# Patient Record
Sex: Female | Born: 1985 | Race: Black or African American | Hispanic: No | Marital: Single | Smoking: Never smoker
Health system: Southern US, Community
[De-identification: ages and names within clinical notes are randomized; demographics above are authoritative.]

## PROBLEM LIST (undated history)

## (undated) DIAGNOSIS — R002 Palpitations: Secondary | ICD-10-CM

## (undated) DIAGNOSIS — D649 Anemia, unspecified: Secondary | ICD-10-CM

## (undated) DIAGNOSIS — I340 Nonrheumatic mitral (valve) insufficiency: Secondary | ICD-10-CM

## (undated) DIAGNOSIS — R5383 Other fatigue: Secondary | ICD-10-CM

## (undated) DIAGNOSIS — R011 Cardiac murmur, unspecified: Secondary | ICD-10-CM

## (undated) DIAGNOSIS — R0789 Other chest pain: Secondary | ICD-10-CM

## (undated) HISTORY — DX: Anemia, unspecified: D64.9

## (undated) HISTORY — DX: Cardiac murmur, unspecified: R01.1

## (undated) HISTORY — DX: Nonrheumatic mitral (valve) insufficiency: I34.0

## (undated) HISTORY — DX: Other fatigue: R53.83

## (undated) HISTORY — DX: Other chest pain: R07.89

## (undated) HISTORY — DX: Palpitations: R00.2

---

## 2006-11-27 ENCOUNTER — Emergency Department (HOSPITAL_COMMUNITY): Admission: EM | Admit: 2006-11-27 | Discharge: 2006-11-27 | Payer: Self-pay | Admitting: Family Medicine

## 2008-12-17 ENCOUNTER — Emergency Department (HOSPITAL_COMMUNITY): Admission: EM | Admit: 2008-12-17 | Discharge: 2008-12-17 | Payer: Self-pay | Admitting: Emergency Medicine

## 2009-04-27 ENCOUNTER — Emergency Department (HOSPITAL_COMMUNITY): Admission: EM | Admit: 2009-04-27 | Discharge: 2009-04-27 | Payer: Self-pay | Admitting: Emergency Medicine

## 2010-05-28 ENCOUNTER — Emergency Department (HOSPITAL_COMMUNITY): Admission: EM | Admit: 2010-05-28 | Discharge: 2010-05-29 | Payer: Self-pay | Admitting: Internal Medicine

## 2010-09-15 ENCOUNTER — Ambulatory Visit: Payer: Self-pay | Admitting: Obstetrics and Gynecology

## 2010-12-15 LAB — URINALYSIS, ROUTINE W REFLEX MICROSCOPIC
Bilirubin Urine: NEGATIVE
Nitrite: NEGATIVE
Urobilinogen, UA: 0.2 mg/dL (ref 0.0–1.0)
pH: 6 (ref 5.0–8.0)

## 2010-12-15 LAB — CBC
HCT: 28 % — ABNORMAL LOW (ref 36.0–46.0)
Hemoglobin: 9.5 g/dL — ABNORMAL LOW (ref 12.0–15.0)
MCH: 29.2 pg (ref 26.0–34.0)
MCHC: 33.9 g/dL (ref 30.0–36.0)
MCV: 86.2 fL (ref 78.0–100.0)
Platelets: 254 10*3/uL (ref 150–400)
RBC: 3.25 MIL/uL — ABNORMAL LOW (ref 3.87–5.11)
RDW: 12.3 % (ref 11.5–15.5)
WBC: 14.1 10*3/uL — ABNORMAL HIGH (ref 4.0–10.5)

## 2010-12-15 LAB — LIPASE, BLOOD: Lipase: 20 U/L (ref 11–59)

## 2010-12-15 LAB — COMPREHENSIVE METABOLIC PANEL
Alkaline Phosphatase: 36 U/L — ABNORMAL LOW (ref 39–117)
BUN: 11 mg/dL (ref 6–23)
Calcium: 8.7 mg/dL (ref 8.4–10.5)
GFR calc non Af Amer: >60 mL/min (ref >60)
Total Bilirubin: 1.1 mg/dL (ref 0.3–1.2)
Total Protein: 7.1 g/dL (ref 6.0–8.3)

## 2010-12-15 LAB — DIFFERENTIAL
Basophils Absolute: 0 10*3/uL (ref 0.0–0.1)
Eosinophils Relative: 0 % (ref 0–5)
Lymphocytes Relative: 11 % — ABNORMAL LOW (ref 12–46)
Neutrophils Relative %: 84 % — ABNORMAL HIGH (ref 43–77)

## 2011-01-07 LAB — POCT URINALYSIS DIP (DEVICE)
Bilirubin Urine: NEGATIVE
Glucose, UA: NEGATIVE mg/dL
Hgb urine dipstick: NEGATIVE
Ketones, ur: 40 mg/dL — AB
Urobilinogen, UA: 0.2 mg/dL (ref 0.0–1.0)
pH: 5.5 (ref 5.0–8.0)

## 2011-01-08 DIAGNOSIS — J309 Allergic rhinitis, unspecified: Secondary | ICD-10-CM | POA: Insufficient documentation

## 2011-01-11 LAB — POCT URINALYSIS DIP (DEVICE)
Bilirubin Urine: NEGATIVE
Glucose, UA: NEGATIVE mg/dL
Specific Gravity, Urine: 1.015 (ref 1.005–1.030)

## 2011-01-11 LAB — WET PREP, GENITAL: Yeast Wet Prep HPF POC: NONE SEEN

## 2011-01-11 LAB — GC/CHLAMYDIA PROBE AMP, GENITAL: Chlamydia, DNA Probe: POSITIVE — AB

## 2011-01-11 LAB — POCT PREGNANCY, URINE: Preg Test, Ur: NEGATIVE

## 2011-04-10 ENCOUNTER — Ambulatory Visit (HOSPITAL_COMMUNITY): Admission: EM | Admit: 2011-04-10 | Payer: Self-pay | Source: Ambulatory Visit | Admitting: Obstetrics and Gynecology

## 2013-01-12 DIAGNOSIS — N926 Irregular menstruation, unspecified: Secondary | ICD-10-CM | POA: Insufficient documentation

## 2013-08-07 DIAGNOSIS — N899 Noninflammatory disorder of vagina, unspecified: Secondary | ICD-10-CM | POA: Insufficient documentation

## 2013-09-29 DIAGNOSIS — Z8742 Personal history of other diseases of the female genital tract: Secondary | ICD-10-CM | POA: Insufficient documentation

## 2014-01-21 DIAGNOSIS — D509 Iron deficiency anemia, unspecified: Secondary | ICD-10-CM | POA: Insufficient documentation

## 2014-01-21 DIAGNOSIS — IMO0002 Reserved for concepts with insufficient information to code with codable children: Secondary | ICD-10-CM | POA: Insufficient documentation

## 2016-01-03 ENCOUNTER — Encounter: Payer: Self-pay | Admitting: *Deleted

## 2016-01-27 ENCOUNTER — Ambulatory Visit (INDEPENDENT_AMBULATORY_CARE_PROVIDER_SITE_OTHER): Payer: BLUE CROSS/BLUE SHIELD | Admitting: Family Medicine

## 2016-01-27 ENCOUNTER — Encounter: Payer: Self-pay | Admitting: Family Medicine

## 2016-01-27 VITALS — BP 141/83 | HR 82 | Wt 132.0 lb

## 2016-01-27 DIAGNOSIS — N898 Other specified noninflammatory disorders of vagina: Secondary | ICD-10-CM | POA: Diagnosis not present

## 2016-01-27 DIAGNOSIS — Z01419 Encounter for gynecological examination (general) (routine) without abnormal findings: Secondary | ICD-10-CM | POA: Diagnosis not present

## 2016-01-27 DIAGNOSIS — Z0000 Encounter for general adult medical examination without abnormal findings: Secondary | ICD-10-CM

## 2016-01-27 DIAGNOSIS — Z1151 Encounter for screening for human papillomavirus (HPV): Secondary | ICD-10-CM | POA: Diagnosis not present

## 2016-01-27 DIAGNOSIS — Z124 Encounter for screening for malignant neoplasm of cervix: Secondary | ICD-10-CM | POA: Diagnosis not present

## 2016-01-27 NOTE — Progress Notes (Signed)
  Subjective:     Samantha Tapia is a 30 y.o. female here for a routine exam.  Current complaints: none.  Personal health questionnaire reviewed: not asked.   Gynecologic History Patient's last menstrual period was 01/11/2016. Contraception: none Last Pap: last year. Results were: normal Last mammogram: n/A Obstetric History OB History  No data available     The following portions of the patient's history were reviewed and updated as appropriate: allergies, current medications, past family history, past medical history, past social history, past surgical history and problem list.  Review of Systems Pertinent items are noted in HPI.    Objective:    BP 141/83 mmHg  Pulse 82  Wt 132 lb (59.875 kg)  LMP 01/11/2016  General Appearance:    Alert, cooperative, no distress, appears stated age  Head:    Normocephalic, without obvious abnormality, atraumatic  Eyes:    PERRL, conjunctiva/corneas clear, EOM's intact, fundi    benign, both eyes  Neck:   Supple, symmetrical, trachea midline, no adenopathy;    thyroid:  no enlargement/tenderness/nodules; no carotid   bruit or JVD  Back:     Symmetric, no curvature, ROM normal, no CVA tenderness  Lungs:     Clear to auscultation bilaterally, respirations unlabored  Chest Wall:    No tenderness or deformity   Heart:    Regular rate and rhythm, S1 and S2 normal, no murmur, rub   or gallop  Breast Exam:    deferred  Abdomen:     Soft, non-tender, bowel sounds active all four quadrants,    no masses, no organomegaly  Genitalia:    Normal female without lesion, discharge or tenderness  Extremities:   Extremities normal, atraumatic, no cyanosis or edema  Pulses:   2+ and symmetric all extremities  Skin:   Skin color, texture, turgor normal, no rashes or lesions  Lymph nodes:   Cervical, supraclavicular, and axillary nodes normal  Neurologic:   CNII-XII intact, normal strength, sensation and reflexes    throughout      Assessment:    Healthy female exam.    Plan:    Education reviewed: safe sex/STD prevention and self breast exams. Contraception: none. Follow up in: 1 year.  Pap done, wet prep

## 2016-01-28 ENCOUNTER — Other Ambulatory Visit: Payer: Self-pay | Admitting: Family Medicine

## 2016-01-28 DIAGNOSIS — N760 Acute vaginitis: Principal | ICD-10-CM

## 2016-01-28 DIAGNOSIS — B9689 Other specified bacterial agents as the cause of diseases classified elsewhere: Secondary | ICD-10-CM

## 2016-01-28 LAB — WET PREP, GENITAL
TRICH WET PREP: NONE SEEN
Yeast Wet Prep HPF POC: NONE SEEN

## 2016-01-29 ENCOUNTER — Other Ambulatory Visit: Payer: Self-pay | Admitting: Family Medicine

## 2016-01-29 MED ORDER — METRONIDAZOLE 500 MG PO TABS: 500.0000 mg | ORAL_TABLET | Freq: Two times a day (BID) | ORAL | Status: DC

## 2016-01-30 LAB — CYTOLOGY - PAP

## 2016-01-31 MED ORDER — METRONIDAZOLE 0.75 % VA GEL: 1.0000 | Freq: Two times a day (BID) | VAGINAL | Status: DC

## 2016-01-31 MED ORDER — METRONIDAZOLE 0.75 % VA GEL: 1.0000 | Freq: Two times a day (BID) | VAGINAL | Status: AC

## 2016-01-31 MED ORDER — METRONIDAZOLE 0.75 % VA GEL
1.0000 | Freq: Two times a day (BID) | VAGINAL | Status: DC
Start: 2016-01-31 — End: 2016-01-31

## 2016-02-23 ENCOUNTER — Emergency Department (HOSPITAL_COMMUNITY)
Admission: EM | Admit: 2016-02-23 | Discharge: 2016-02-23 | Disposition: A | Discharge status: Home / Self Care | Payer: BLUE CROSS/BLUE SHIELD | Attending: Emergency Medicine | Admitting: Emergency Medicine

## 2016-02-23 ENCOUNTER — Encounter (HOSPITAL_COMMUNITY): Payer: Self-pay | Admitting: Neurology

## 2016-02-23 DIAGNOSIS — Y939 Activity, unspecified: Secondary | ICD-10-CM | POA: Diagnosis not present

## 2016-02-23 DIAGNOSIS — S0990XA Unspecified injury of head, initial encounter: Secondary | ICD-10-CM | POA: Diagnosis not present

## 2016-02-23 DIAGNOSIS — Y92410 Unspecified street and highway as the place of occurrence of the external cause: Secondary | ICD-10-CM | POA: Insufficient documentation

## 2016-02-23 DIAGNOSIS — Y999 Unspecified external cause status: Secondary | ICD-10-CM | POA: Diagnosis not present
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
## 2016-02-23 NOTE — Discharge Instructions (Signed)

## 2017-07-03 ENCOUNTER — Encounter (HOSPITAL_BASED_OUTPATIENT_CLINIC_OR_DEPARTMENT_OTHER): Payer: Self-pay | Admitting: Emergency Medicine

## 2017-07-03 ENCOUNTER — Emergency Department (HOSPITAL_BASED_OUTPATIENT_CLINIC_OR_DEPARTMENT_OTHER): Payer: Self-pay

## 2017-07-03 ENCOUNTER — Emergency Department (HOSPITAL_BASED_OUTPATIENT_CLINIC_OR_DEPARTMENT_OTHER)
Admission: EM | Admit: 2017-07-03 | Discharge: 2017-07-03 | Disposition: A | Discharge status: Home / Self Care | Payer: Self-pay | Attending: Emergency Medicine | Admitting: Emergency Medicine

## 2017-07-03 DIAGNOSIS — Y929 Unspecified place or not applicable: Secondary | ICD-10-CM | POA: Insufficient documentation

## 2017-07-03 DIAGNOSIS — Y9389 Activity, other specified: Secondary | ICD-10-CM | POA: Insufficient documentation

## 2017-07-03 DIAGNOSIS — W230XXA Caught, crushed, jammed, or pinched between moving objects, initial encounter: Secondary | ICD-10-CM | POA: Insufficient documentation

## 2017-07-03 DIAGNOSIS — Y999 Unspecified external cause status: Secondary | ICD-10-CM | POA: Insufficient documentation

## 2017-07-03 DIAGNOSIS — M79644 Pain in right finger(s): Secondary | ICD-10-CM | POA: Insufficient documentation

## 2017-07-03 NOTE — ED Triage Notes (Signed)
R thumb pain and swelling after slamming it in a window 10 days ago.

## 2017-07-03 NOTE — Discharge Instructions (Signed)
Please read and follow all provided instructions.  Your diagnoses today include:  1. Thumb pain, right    Tests performed today include:  An x-ray of the affected area - does NOT show any broken bones  Vital signs. See below for your results today.   Medications prescribed:   None  Take any prescribed medications only as directed.  Home care instructions:   Follow any educational materials contained in this packet  Follow R.I.C.E. Protocol:  R - rest your injury   I  - use ice on injury without applying directly to skin  C - compress injury with bandage or splint  E - elevate the injury as much as possible  Follow-up instructions: Please follow-up with your primary care provider as needed if symptoms not improving.   Return instructions:   Please return if your fingers are numb or tingling, appear gray or blue, or you have severe pain (also elevate the arm and loosen splint or wrap if you were given one)  Please return to the Emergency Department if you experience worsening symptoms.   Please return if you have any other emergent concerns.  Additional Information:  Your vital signs today were: BP 124/74 (BP Location: Left Arm)    Pulse 72    Temp 98.5 F (36.9 C) (Oral)    Resp 18    Ht  (1.575 m)    Wt 63 kg (139 lb)    LMP 06/17/2017    SpO2 100%    BMI 25.42 kg/m  If your blood pressure (BP) was elevated above 135/85 this visit, please have this repeated by your doctor within one month. --------------

## 2017-07-03 NOTE — ED Provider Notes (Signed)
[facility]   CSN: [id number] Arrival date & time: 07/03/17  1813     History   Chief Complaint Chief Complaint  Patient presents with  . Finger Injury    HPI Samantha Tapia is a 31 y.o. female.  Patient presents with complaint of ongoing left thumb pain starting acutely and gradually improving since closing her thumb and a window 10 days ago. Patient had some swelling at the base of the fingernail over the first several days which has improved and she reported a throbbing pain at the onset. Her thumb is still sore although she is moving it well. She is concerned about a fracture.  Also complains of waxing and waning left medial eye pain and irritation over the past several months. No associated swelling or redness.      History reviewed. No pertinent past medical history.  There are no active problems to display for this patient.   History reviewed. No pertinent surgical history.  OB History    No data available       Home Medications    Prior to Admission medications   Medication Sig Start Date End Date Taking? Authorizing Provider  ferrous sulfate 325 (65 FE) MG tablet Take 325 mg by mouth daily with breakfast.    [provider]  metroNIDAZOLE (FLAGYL) 500 MG tablet Take 1 tablet (500 mg total) by mouth 2 (two) times daily. Patient not taking: Reported on 02/23/2016 01/29/16   Levie Heritage, DO  Multiple Vitamin (MULTIVITAMIN WITH MINERALS) TABS tablet Take 1 tablet by mouth daily.    [provider]    Family History No family history on file.  Social History Social History  Substance Use Topics  . Smoking status: Never Smoker  . Smokeless tobacco: Never Used  . Alcohol use 0.0 oz/week     Allergies   Shellfish-derived products and Sulfur   Review of Systems Review of Systems  Constitutional: Negative for activity change.  HENT: Negative for ear discharge and ear pain.   Eyes: Positive for pain  and redness.  Musculoskeletal: Positive for arthralgias. Negative for joint swelling.  Skin: Negative for wound.  Neurological: Negative for weakness and numbness.     Physical Exam Updated Vital Signs BP 124/74 (BP Location: Left Arm)   Pulse 72   Temp 98.5 F (36.9 C) (Oral)   Resp 18   Ht  (1.575 m)   Wt 63 kg (139 lb)   LMP 06/17/2017   SpO2 100%   BMI 25.42 kg/m   Physical Exam  Constitutional: She appears well-developed and well-nourished.  HENT:  Head: Normocephalic and atraumatic.  Eyes: Pupils are equal, round, and reactive to light.  Normal conjunctiva. Tear duct appears normal. No stye or cysts noted.  Neck: Normal range of motion. Neck supple.  Cardiovascular: Normal pulses.  Exam reveals no decreased pulses.   Musculoskeletal: She exhibits tenderness. She exhibits no edema.  Neurological: She is alert. No sensory deficit.  Motor, sensation, and vascular distal to the injury is fully intact. Patient with mild pain without swelling the inferior margin of the nail. Also mild pain with palpation between the MCP and IP joint. Patient with full range of motion of the digit.  Skin: Skin is warm and dry.  Psychiatric: She has a normal mood and affect.  Nursing note and vitals reviewed.    ED Treatments / Results  Labs (all labs ordered are listed, but only abnormal results are displayed) Labs Reviewed -  No data to display  EKG  EKG Interpretation None       Radiology No results found.  Procedures Procedures (including critical care time)  Medications Ordered in ED Medications - No data to display   Initial Impression / Assessment and Plan / ED Course  I have reviewed the triage vital signs and the nursing notes.  Pertinent labs & imaging results that were available during my care of the patient were reviewed by me and considered in my medical decision making (see chart for details).     Patient seen and examined. Awaiting x-ray.   Vital  signs reviewed and are as follows: BP 124/74 (BP Location: Left Arm)   Pulse 72   Temp 98.5 F (36.9 C) (Oral)   Resp 18   Ht  (1.575 m)   Wt 63 kg (139 lb)   LMP 06/17/2017   SpO2 100%   BMI 25.42 kg/m   Patient updated on x-ray results. Discussed typical resolution of symptoms. She may have a subungual hematoma which will resolve with time underneath her nail.  Discussed use of warm compresses antihistamines for any eye symptoms.  Patient urged to return with worsening symptoms or other concerns. Patient verbalized understanding and agrees with plan.    Final Clinical Impressions(s) / ED Diagnoses   Final diagnoses:  Thumb pain, right   Patient with pain in right thumb after injury nearly 2 weeks ago. No fracture noted. Will continue conservative treatment.  Patient with chronically intermittent eye symptoms. Normal exam tonight.  New Prescriptions Discharge Medication List as of 07/03/2017  7:45 PM       Renne Crigler, PA-C 07/03/17 2042    Long, Arlyss Repress, MD 07/04/17 [phone]

## 2018-03-13 ENCOUNTER — Other Ambulatory Visit: Payer: Self-pay | Admitting: Obstetrics and Gynecology

## 2018-09-06 ENCOUNTER — Other Ambulatory Visit: Payer: Self-pay

## 2018-09-06 ENCOUNTER — Encounter (HOSPITAL_BASED_OUTPATIENT_CLINIC_OR_DEPARTMENT_OTHER): Payer: Self-pay | Admitting: Student

## 2018-09-06 ENCOUNTER — Emergency Department (HOSPITAL_BASED_OUTPATIENT_CLINIC_OR_DEPARTMENT_OTHER)
Admission: EM | Admit: 2018-09-06 | Discharge: 2018-09-06 | Disposition: A | Discharge status: Home / Self Care | Payer: 59 | Attending: Emergency Medicine | Admitting: Emergency Medicine

## 2018-09-06 ENCOUNTER — Emergency Department (HOSPITAL_BASED_OUTPATIENT_CLINIC_OR_DEPARTMENT_OTHER): Payer: 59

## 2018-09-06 DIAGNOSIS — J069 Acute upper respiratory infection, unspecified: Secondary | ICD-10-CM | POA: Insufficient documentation

## 2018-09-06 DIAGNOSIS — B9789 Other viral agents as the cause of diseases classified elsewhere: Secondary | ICD-10-CM | POA: Insufficient documentation

## 2018-09-06 DIAGNOSIS — Z79899 Other long term (current) drug therapy: Secondary | ICD-10-CM | POA: Insufficient documentation

## 2018-09-06 DIAGNOSIS — R0981 Nasal congestion: Secondary | ICD-10-CM | POA: Diagnosis present

## 2018-09-06 MED ORDER — BENZONATATE 100 MG PO CAPS: 100.0000 mg | ORAL_CAPSULE | Freq: Three times a day (TID) | ORAL | 0 refills | Status: DC

## 2018-09-06 MED ORDER — FLUTICASONE PROPIONATE 50 MCG/ACT NA SUSP: 1.0000 | Freq: Every day | NASAL | 0 refills | Status: AC

## 2018-09-06 MED ORDER — NAPROXEN 500 MG PO TABS: 500.0000 mg | ORAL_TABLET | Freq: Two times a day (BID) | ORAL | 0 refills | Status: AC

## 2018-09-06 NOTE — ED Provider Notes (Signed)
[facility] EMERGENCY DEPARTMENT Provider Note   CSN: [id number] Arrival date & time: 09/06/18  1634     History   Chief Complaint Chief Complaint  Patient presents with  . Nasal Congestion    HPI Samantha Tapia Adney is a 32 y.o. female without significant past medical hx who presents to the ED with URI sxs for the past 7-10 days. States she has had congestion, rhinorrhea, and dry cough. Has tried multiple OTC modalities including mucinex, theraflu, and tylenol without relief. Denies fever, sore throat, ear pain, chest pain, or dyspnea. + sick contacts with similar sxs.   HPI  No past medical history on file.  There are no active problems to display for this patient.   No past surgical history on file.   OB History   None      Home Medications    Prior to Admission medications   Medication Sig Start Date End Date Taking? Authorizing Provider  ferrous sulfate 325 (65 FE) MG tablet Take 325 mg by mouth daily with breakfast.    [provider]  Multiple Vitamin (MULTIVITAMIN WITH MINERALS) TABS tablet Take 1 tablet by mouth daily.    [provider]    Family History No family history on file.  Social History Social History   Tobacco Use  . Smoking status: Never Smoker  . Smokeless tobacco: Never Used  Substance Use Topics  . Alcohol use: Yes    Alcohol/week: 0.0 standard drinks  . Drug use: Not on file     Allergies   Shellfish-derived products and Sulfur   Review of Systems Review of Systems  Constitutional: Negative for chills and fever.  HENT: Positive for congestion and rhinorrhea. Negative for ear pain, sore throat, trouble swallowing and voice change.   Respiratory: Positive for cough. Negative for shortness of breath.   Cardiovascular: Negative for chest pain.  Gastrointestinal: Negative for abdominal pain, diarrhea and vomiting.     Physical Exam Updated Vital Signs BP 119/75 (BP Location: Right Arm)   Pulse 92    Temp 99.2 F (37.3 C) (Oral)   Resp 16   Ht 5\' 2"  (1.575 m)   Wt 63.5 kg   LMP 08/13/2018   SpO2 100%   BMI 25.61 kg/m   Physical Exam Constitutional: He appears well-developed and well-nourished.  Non-toxic appearance. No distress.  HENT:  Head: Normocephalic and atraumatic.  Right Ear: Tympanic membrane and ear canal normal. Tympanic membrane is not erythematous, not retracted and not bulging.  Left Ear: Tympanic membrane and ear canal normal. Tympanic membrane is not erythematous, not retracted and not bulging.  Nose: Mucosal edema present. Right sinus exhibits no maxillary sinus tenderness and no frontal sinus tenderness. Left sinus exhibits no maxillary sinus tenderness and no frontal sinus tenderness.  Mouth/Throat: Uvula is midline and oropharynx is clear and moist. No oropharyngeal exudate.  Posterior oropharynx is symmetric appearing. Patient tolerating own secretions without difficulty. No trismus. No drooling. No hot potato voice. No swelling beneath the tongue, submandibular compartment is soft.   Eyes: Pupils are equal, round, and reactive to light. Conjunctivae and EOM are normal. Right eye exhibits no discharge. Left eye exhibits no discharge.  Neck: Normal range of motion. Neck supple. No neck rigidity. No edema and no erythema present.  Cardiovascular: Normal rate and regular rhythm.  Pulmonary/Chest: Effort normal and breath sounds normal. No respiratory distress. He has no wheezes. He has no rhonchi. He has no rales.  Respiration even and unlabored  Abdominal:  Soft. He exhibits no distension. There is no tenderness.  Neurological: He is alert.  Clear speech.   Skin: Skin is warm and dry. No rash noted.  Psychiatric: He has a normal mood and affect. His behavior is normal.  Nursing note and vitals reviewed.  ED Treatments / Results  Labs (all labs ordered are listed, but only abnormal results are displayed) Labs Reviewed - No data to  display  EKG None  Radiology Dg Chest 2 View  Result Date: 09/06/2018 CLINICAL DATA:  Cough and congestion for 1 week. EXAM: CHEST - 2 VIEW COMPARISON:  06/09/2018 FINDINGS: The heart size and mediastinal contours are within normal limits. Both lungs are clear. The visualized skeletal structures are unremarkable. IMPRESSION: Normal chest x-ray. Electronically Signed   By: Rudie Meyer M.D.   On: 09/06/2018 18:42    Procedures Procedures (including critical care time)  Medications Ordered in ED Medications - No data to display   Initial Impression / Assessment and Plan / ED Course  I have reviewed the triage vital signs and the nursing notes.  Pertinent labs & imaging results that were available during my care of the patient were reviewed by me and considered in my medical decision making (see chart for details).    Patient presents with URI type symptoms.  Patient is nontoxic appearing, in no apparent distress, vitals are WNL. Patient is afebrile in the ED, lungs are CTA, CXR negative for infiltrate, doubt pneumonia. There is no wheezing or signs of respiratory distress. Afebrile, no sinus tenderness, doubt acute bacterial sinusitis. Centor score 0, doubt strep pharyngitis. No evidence of AOM on exam. No meningeal signs.  Suspect viral vs allergic etiology at this time, favor viral, and will treat supportively with naproxen Flonase, and Tessalon. I discussed results, treatment plan, need for PCP follow-up, and return precautions with the patient. Provided opportunity for questions, patient confirmed understanding and is in agreement with plan.    Final Clinical Impressions(s) / ED Diagnoses   Final diagnoses:  Viral URI with cough    ED Discharge Orders         Ordered    fluticasone (FLONASE) 50 MCG/ACT nasal spray  Daily     09/06/18 1920    naproxen (NAPROSYN) 500 MG tablet  2 times daily     09/06/18 1920    benzonatate (TESSALON) 100 MG capsule  Every 8 hours     09/06/18  1920           Cherly Anderson, PA-C 09/06/18 1921    Arby Barrette, MD 09/11/18 [phone]

## 2018-09-06 NOTE — ED Triage Notes (Signed)
Congestion x 1 week. Dry cough. Cold sx a week ago which have improved

## 2018-09-06 NOTE — Discharge Instructions (Addendum)
You were seen in the emergency today for upper respiratory symptoms, we suspect your symptoms are related to allergies or a virus at this time. Your chest xray did not show pneumonia. I have prescribed you multiple medications to treat your symptoms.   -Flonase to be used 1 spray in each nostril daily.  This medication is used to treat your congestion.  -Tessalon can be taken once every 8 hours as needed.  This medication is used to treat your cough.  - Naproxen is a nonsteroidal anti-inflammatory medication that will help with pain and swelling. Be sure to take this medication as prescribed with food, 1 pill every 12 hours,  It should be taken with food, as it can cause stomach upset, and more seriously, stomach bleeding. Do not take other nonsteroidal anti-inflammatory medications with this such as Advil, Motrin, Aleve, Mobic, Goodie Powder, or Motrin.    You make take Tylenol per over the counter dosing with these medications.   We have prescribed you new medication(s) today. Discuss the medications prescribed today with your pharmacist as they can have adverse effects and interactions with your other medicines including over the counter and prescribed medications. Seek medical evaluation if you start to experience new or abnormal symptoms after taking one of these medicines, seek care immediately if you start to experience difficulty breathing, feeling of your throat closing, facial swelling, or rash as these could be indications of a more serious allergic reaction   You will need to follow-up with your primary care provider in 1 week if your symptoms have not improved.  If you do not have a primary care provider one is provided in your discharge instructions.  Return to the emergency department for any new or worsening symptoms including but not limited to persistent fever for 5 days, difficulty breathing, chest pain, rashes, passing out, or any other concerns.

## 2018-09-19 ENCOUNTER — Other Ambulatory Visit: Payer: Self-pay

## 2018-09-19 ENCOUNTER — Encounter (HOSPITAL_COMMUNITY): Payer: Self-pay

## 2018-09-19 ENCOUNTER — Emergency Department (HOSPITAL_COMMUNITY): Payer: 59

## 2018-09-19 ENCOUNTER — Emergency Department (HOSPITAL_COMMUNITY)
Admission: EM | Admit: 2018-09-19 | Discharge: 2018-09-19 | Disposition: A | Discharge status: Home / Self Care | Payer: 59 | Attending: Emergency Medicine | Admitting: Emergency Medicine

## 2018-09-19 DIAGNOSIS — Z79899 Other long term (current) drug therapy: Secondary | ICD-10-CM | POA: Insufficient documentation

## 2018-09-19 DIAGNOSIS — R002 Palpitations: Secondary | ICD-10-CM | POA: Diagnosis not present

## 2018-09-19 DIAGNOSIS — R079 Chest pain, unspecified: Secondary | ICD-10-CM | POA: Diagnosis present

## 2018-09-19 LAB — CBC
HCT: 36.7 % (ref 36.0–46.0)
Hemoglobin: 11.7 g/dL — ABNORMAL LOW (ref 12.0–15.0)
MCH: 29.3 pg (ref 26.0–34.0)
MCHC: 31.9 g/dL (ref 30.0–36.0)
MCV: 91.8 fL (ref 80.0–100.0)
PLATELETS: 427 10*3/uL — AB (ref 150–400)
RBC: 4 MIL/uL (ref 3.87–5.11)
RDW: 13 % (ref 11.5–15.5)
WBC: 8.5 10*3/uL (ref 4.0–10.5)
nRBC: 0 % (ref 0.0–0.2)

## 2018-09-19 LAB — BASIC METABOLIC PANEL
Anion gap: 12 (ref 5–15)
BUN: 13 mg/dL (ref 6–20)
CO2: 24 mmol/L (ref 22–32)
Calcium: 9.6 mg/dL (ref 8.9–10.3)
Chloride: 102 mmol/L (ref 98–111)
Creatinine, Ser: 0.75 mg/dL (ref 0.44–1.00)
GFR calc Af Amer: >60 mL/min (ref >60)
Glucose, Bld: 89 mg/dL (ref 70–99)
Potassium: 3.8 mmol/L (ref 3.5–5.1)
Sodium: 138 mmol/L (ref 135–145)

## 2018-09-19 LAB — I-STAT BETA HCG BLOOD, ED (MC, WL, AP ONLY): I-stat hCG, quantitative: <5 m[IU]/mL (ref <5)

## 2018-09-19 LAB — D-DIMER, QUANTITATIVE: D-Dimer, Quant: 0.27 ug/mL-FEU (ref 0.00–0.50)

## 2018-09-19 LAB — I-STAT TROPONIN, ED: TROPONIN I, POC: 0 ng/mL (ref 0.00–0.08)

## 2018-09-19 MED ORDER — ACETAMINOPHEN 325 MG PO TABS: 650.0000 mg | ORAL_TABLET | Freq: Once | ORAL | Status: DC

## 2018-09-19 NOTE — Discharge Instructions (Addendum)
Avoid avoid caffinated products, such as teas, colas, coffee, chocolate. Avoid over the counter cold medicines, herbal or "natural vitamin" products, and illicit drugs because they can contain stimulants.  Call your regular medical doctor Monday to schedule a follow up appointment next week. Call your Cardiologist on Monday to schedule a follow up appointment within the next week.  Return to the Emergency Department immediately if worsening.

## 2018-09-19 NOTE — ED Provider Notes (Signed)
[facility]-EMERGENCY DEPT Provider Note   CSN: [id number] Arrival date & time: 09/19/18  1745     History   Chief Complaint Chief Complaint  Patient presents with  . Chest Pain  . Palpitations    HPI Samantha Tapia is a 32 y.o. female.  HPI Pt was seen at 1805. Per pt, c/o gradual onset and persistence of multiple intermittent episodes of palpitations for the past 2 weeks, more constant for the past 1 week. Has been associated with constant mid-sternal chest "pressure" and "stabbing" right sided chest pain. States she feels a "fluttering sensation" in her chest and "feels like I can't catch my breath sometimes." Denies new meds. Denies cough, no fevers, no back pain, no abd pain, no N/V/D, no injury.    History reviewed. No pertinent past medical history.  There are no active problems to display for this patient.   History reviewed. No pertinent surgical history.   OB History   No obstetric history on file.      Home Medications    Prior to Admission medications   Medication Sig Start Date End Date Taking? Authorizing Provider  ferrous sulfate 325 (65 FE) MG tablet Take 325 mg by mouth daily with breakfast.   Yes [provider]  fluticasone (FLONASE) 50 MCG/ACT nasal spray Place 1 spray into both nostrils daily. Patient taking differently: Place 1 spray into both nostrils daily as needed for allergies.  09/06/18  Yes Petrucelli, Samantha R, PA-C  Multiple Vitamin (MULTIVITAMIN WITH MINERALS) TABS tablet Take 1 tablet by mouth daily.   Yes [provider]  Probiotic Product (PROBIOTIC-10) CAPS Take 1 capsule by mouth daily.   Yes [provider]  benzonatate (TESSALON) 100 MG capsule Take 1 capsule (100 mg total) by mouth every 8 (eight) hours. Patient not taking: Reported on 09/19/2018 09/06/18   Petrucelli, Lelon Mast R, PA-C  naproxen (NAPROSYN) 500 MG tablet Take 1 tablet (500 mg total) by mouth 2 (two) times  daily. Patient not taking: Reported on 09/19/2018 09/06/18   Petrucelli, Pleas Koch, PA-C    Family History Family History  Problem Relation Age of Onset  . Heart failure Mother   . Diabetes Father     Social History Social History   Tobacco Use  . Smoking status: Never Smoker  . Smokeless tobacco: Never Used  Substance Use Topics  . Alcohol use: Yes    Alcohol/week: 0.0 standard drinks  . Drug use: Never     Allergies   Shellfish-derived products and Sulfur   Review of Systems Review of Systems ROS: Statement: All systems negative except as marked or noted in the HPI; Constitutional: Negative for fever and chills. ; ; Eyes: Negative for eye pain, redness and discharge. ; ; ENMT: Negative for ear pain, hoarseness, nasal congestion, sinus pressure and sore throat. ; ; Cardiovascular: +CP, palpitations. Negative for diaphoresis, and peripheral edema. ; ; Respiratory: Negative for cough, wheezing and stridor. ; ; Gastrointestinal: Negative for nausea, vomiting, diarrhea, abdominal pain, blood in stool, hematemesis, jaundice and rectal bleeding. . ; ; Genitourinary: Negative for dysuria, flank pain and hematuria. ; ; Musculoskeletal: Negative for back pain and neck pain. Negative for swelling and trauma.; ; Skin: Negative for pruritus, rash, abrasions, blisters, bruising and skin lesion.; ; Neuro: Negative for headache, lightheadedness and neck stiffness. Negative for weakness, altered level of consciousness, altered mental status, extremity weakness, paresthesias, involuntary movement, seizure and syncope.       Physical Exam Updated Vital Signs  BP 120/79   Pulse 93   Temp 98.2 F (36.8 C) (Oral)   Resp 18   Ht 5\' 2"  (1.575 m)   Wt 63.5 kg   LMP 09/11/2018   SpO2 100%   BMI 25.61 kg/m   Physical Exam 1810: Physical examination:  Nursing notes reviewed; Vital signs and O2 SAT reviewed;  Constitutional: Well developed, Well nourished, Well hydrated, In no acute distress;  Head:  Normocephalic, atraumatic; Eyes: EOMI, PERRL, No scleral icterus; ENMT: Mouth and pharynx normal, Mucous membranes moist; Neck: Supple, Full range of motion, No lymphadenopathy; Cardiovascular: Regular rate and rhythm, No gallop. States she is having palpitations during exam, HR 80's and 90's on monitor.; Respiratory: Breath sounds clear & equal bilaterally, [place].  Speaking full sentences with ease, Normal respiratory effort/excursion; Chest: Nontender, Movement normal; Abdomen: Soft, Nontender, Nondistended, Normal bowel sounds; Genitourinary: No CVA tenderness; Extremities: Peripheral pulses normal, No tenderness, No edema, No calf edema or asymmetry.; Neuro: AA&Ox3, Major CN grossly intact.  Speech clear. No gross focal motor or sensory deficits in extremities.; Skin: Color normal, Warm, Dry.   ED Treatments / Results  Labs (all labs ordered are listed, but only abnormal results are displayed)   EKG EKG Interpretation  Date/Time:  Friday September 19 2018 17:54:42 EST Ventricular Rate:  83 PR Interval:    QRS Duration: 94 QT Interval:  372 QTC Calculation: 438 R Axis:   79 Text Interpretation:  Sinus rhythm LAE, consider biatrial enlargement Baseline wander No old tracing to compare Confirmed by Samuel JesterMcManus, Lil Lepage [phone]) on 09/19/2018 6:08:11 PM   Radiology   Procedures Procedures (including critical care time)  Medications Ordered in ED Medications  acetaminophen (TYLENOL) tablet 650 mg (0 mg Oral Hold 09/19/18 1944)     Initial Impression / Assessment and Plan / ED Course  I have reviewed the triage vital signs and the nursing notes.  Pertinent labs & imaging results that were available during my care of the patient were reviewed by me and considered in my medical decision making (see chart for details).  MDM Reviewed: previous chart, nursing note and vitals Reviewed previous: labs Interpretation: labs, ECG and x-ray   Results for orders placed or performed  during the hospital encounter of 09/19/18  Basic metabolic panel  Result Value Ref Range   Sodium 138 135 - 145 mmol/L   Potassium 3.8 3.5 - 5.1 mmol/L   Chloride 102 98 - 111 mmol/L   CO2 24 22 - 32 mmol/L   Glucose, Bld 89 70 - 99 mg/dL   BUN 13 6 - 20 mg/dL   Creatinine, Ser 6.040.75 0.44 - 1.00 mg/dL   Calcium 9.6 8.9 - 54.010.3 mg/dL   GFR calc non Af Amer >60 >60 mL/min   GFR calc Af Amer >60 >60 mL/min   Anion gap 12 5 - 15  CBC  Result Value Ref Range   WBC 8.5 4.0 - 10.5 K/uL   RBC 4.00 3.87 - 5.11 MIL/uL   Hemoglobin 11.7 (L) 12.0 - 15.0 g/dL   HCT 98.136.7 19.136.0 - 47.846.0 %   MCV 91.8 80.0 - 100.0 fL   MCH 29.3 26.0 - 34.0 pg   MCHC 31.9 30.0 - 36.0 g/dL   RDW 29.513.0 62.111.5 - 30.815.5 %   Platelets 427 (H) 150 - 400 K/uL   nRBC 0.0 0.0 - 0.2 %  D-dimer, quantitative  Result Value Ref Range   D-Dimer, Quant 0.27 0.00 - 0.50 ug/mL-FEU  I-stat troponin, ED  Result Value  Ref Range   Troponin i, poc 0.00 0.00 - 0.08 ng/mL   Comment 3          I-Stat beta hCG blood, ED  Result Value Ref Range   I-stat hCG, quantitative <5.0 <5 mIU/mL   Comment 3           Dg Chest 2 View Result Date: 09/19/2018 CLINICAL DATA:  Mid chest pressure and pain.  Shortness of breath. EXAM: CHEST - 2 VIEW COMPARISON:  09/06/2018 FINDINGS: The lungs appear clear.  Cardiac and mediastinal contours normal. No pleural effusion identified.  No appreciable bony abnormality. IMPRESSION: No active cardiopulmonary disease. Electronically Signed   By: Gaylyn RongWalter  Liebkemann M.D.   On: 09/19/2018 18:46    2030:  Doubt PE as cause for symptoms with normal d-dimer and low risk Wells.  Doubt ACS as cause for symptoms with normal troponin and EKG without acute STTW changes after 1 to 2 weeks of constant atypical symptoms.  Workup reassuring. Pt ambulated with O2 Sats remaining 100% R/A, HR 93, gait steady, resps easy, NAD. Pt now states to ED RN that she is "scheduled for an echocardiogram by her Cardiologist." Pt encouraged to f/u  with Cards MD as previously scheduled. Dx and testing d/w pt.  Questions answered.  Verb understanding, agreeable to d/c home with outpt f/u.    Final Clinical Impressions(s) / ED Diagnoses   Final diagnoses:  None    ED Discharge Orders    None       Samuel JesterMcManus, Kwaku Mostafa, DO 09/22/18 [id number]

## 2018-09-19 NOTE — ED Notes (Signed)
Ambulating pulse ox was 100%, HR 93. Pt states she felt "a little weezy" but tolerated ambulation well

## 2018-09-19 NOTE — ED Triage Notes (Addendum)
Patient c/o intermittent heart fluttering and last night she began having mid chest pressure and today, a stabbing pain, SOB, "feels like I can't catch my breath sometimes" and a fluttering sensation.  At the end of triage, patient states she saw her cardiologist and was scheduled for an echo, EKG was completed.

## 2018-10-07 ENCOUNTER — Encounter (HOSPITAL_COMMUNITY): Payer: Self-pay | Admitting: *Deleted

## 2018-10-07 ENCOUNTER — Emergency Department (HOSPITAL_COMMUNITY)
Admission: EM | Admit: 2018-10-07 | Discharge: 2018-10-07 | Disposition: A | Discharge status: Home / Self Care | Payer: 59 | Attending: Emergency Medicine | Admitting: Emergency Medicine

## 2018-10-07 ENCOUNTER — Emergency Department (HOSPITAL_COMMUNITY): Payer: 59

## 2018-10-07 DIAGNOSIS — Z79899 Other long term (current) drug therapy: Secondary | ICD-10-CM | POA: Diagnosis not present

## 2018-10-07 DIAGNOSIS — B9789 Other viral agents as the cause of diseases classified elsewhere: Secondary | ICD-10-CM | POA: Insufficient documentation

## 2018-10-07 DIAGNOSIS — R05 Cough: Secondary | ICD-10-CM | POA: Diagnosis present

## 2018-10-07 DIAGNOSIS — J069 Acute upper respiratory infection, unspecified: Secondary | ICD-10-CM | POA: Diagnosis not present

## 2018-10-07 MED ORDER — IBUPROFEN 200 MG PO TABS
600.0000 mg | ORAL_TABLET | Freq: Once | ORAL | Status: AC
  Administered 2018-10-07: 600 mg via ORAL
  Filled 2018-10-07: qty 3

## 2018-10-07 NOTE — Discharge Instructions (Addendum)
Please read instructions below.  You can take tylenol or ibuprofen as needed for sore throat or fever.  Drink plenty of water.  Use saline nasal spray for congestion. Follow up with your primary care provider as needed.  Return to the ER for inability to swallow liquids, difficulty breathing, or new or worsening symptoms.  

## 2018-10-07 NOTE — ED Triage Notes (Signed)
Pt complains of fever, sneezing, congestion, headache, facial pain, cough 4 days ago. Pt tried cold medicine w/o relief. Pt had fever of 102.5 yesterday. 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

## 2018-10-07 NOTE — ED Provider Notes (Signed)
[facility]-EMERGENCY DEPT Provider Note   CSN: [id number] Arrival date & time: 10/07/18  1301     History   Chief Complaint Chief Complaint  Patient presents with  . Fever  . Cough  . Nasal Congestion    HPI Lynnda Childashanka Noland is a 33 y.o. female without significant past medical history, presenting to the emergency department with 2 days of nasal congestion and fever.  Patient states initially symptoms began on Friday though were more consistent with her seasonal allergies, rhinorrhea, sneezing, watery eyes.  On Sunday she developed a fever with worsening nasal congestion.  She has associated pressure in her sinuses.  This morning she began having a dry cough. Denies sore throat, difficulty breathing or swallowing, abdominal symptoms, urinary symptoms.  Has treated symptoms with DayQuil, NyQuil, Tylenol and Zyrtec.  No known sick contacts however she does work at [employer].  Patient has not had the influenza vaccine this season.  The history is provided by the patient.    History reviewed. No pertinent past medical history.  There are no active problems to display for this patient.   History reviewed. No pertinent surgical history.   OB History   No obstetric history on file.      Home Medications    Prior to Admission medications   Medication Sig Start Date End Date Taking? Authorizing Provider  benzonatate (TESSALON) 100 MG capsule Take 1 capsule (100 mg total) by mouth every 8 (eight) hours. Patient not taking: Reported on 09/19/2018 09/06/18   Petrucelli, Lelon MastSamantha R, PA-C  ferrous sulfate 325 (65 FE) MG tablet Take 325 mg by mouth daily with breakfast.    [provider]  fluticasone (FLONASE) 50 MCG/ACT nasal spray Place 1 spray into both nostrils daily. Patient taking differently: Place 1 spray into both nostrils daily as needed for allergies.  09/06/18   Petrucelli, Samantha R, PA-C  Multiple Vitamin (MULTIVITAMIN WITH MINERALS) TABS  tablet Take 1 tablet by mouth daily.    [provider]  naproxen (NAPROSYN) 500 MG tablet Take 1 tablet (500 mg total) by mouth 2 (two) times daily. Patient not taking: Reported on 09/19/2018 09/06/18   Petrucelli, Lelon MastSamantha R, PA-C  Probiotic Product (PROBIOTIC-10) CAPS Take 1 capsule by mouth daily.    [provider]    Family History Family History  Problem Relation Age of Onset  . Heart failure Mother   . Diabetes Father     Social History Social History   Tobacco Use  . Smoking status: Never Smoker  . Smokeless tobacco: Never Used  Substance Use Topics  . Alcohol use: Yes    Alcohol/week: 0.0 standard drinks  . Drug use: Never     Allergies   Shellfish-derived products and Sulfur   Review of Systems Review of Systems  Constitutional: Positive for fever.  HENT: Positive for congestion, rhinorrhea, sinus pressure and sinus pain. Negative for ear pain, sore throat, trouble swallowing and voice change.   Respiratory: Positive for cough. Negative for shortness of breath.      Physical Exam Updated Vital Signs BP 122/85   Pulse (!) 108   Temp 99.9 F (37.7 C) (Oral)   Resp 20   LMP 09/11/2018   SpO2 100%   Physical Exam Vitals signs and nursing note reviewed.  Constitutional:      General: She is not in acute distress.    Appearance: She is well-developed. She is not ill-appearing or toxic-appearing.  HENT:     Head:  Normocephalic and atraumatic.     Right Ear: Tympanic membrane and ear canal normal.     Left Ear: Tympanic membrane and ear canal normal.     Nose:     Right Sinus: Frontal sinus tenderness present.     Left Sinus: Frontal sinus tenderness present.     Mouth/Throat:     Mouth: Mucous membranes are moist.     Pharynx: Oropharynx is clear.  Eyes:     Conjunctiva/sclera: Conjunctivae normal.  Neck:     Musculoskeletal: Normal range of motion and neck supple. No neck rigidity.  Cardiovascular:     Rate and Rhythm: Regular  rhythm. Tachycardia present.     Heart sounds: Normal heart sounds.  Pulmonary:     Effort: Pulmonary effort is normal.     Breath sounds: Normal breath sounds.  Lymphadenopathy:     Cervical: No cervical adenopathy.  Neurological:     Mental Status: She is alert.  Psychiatric:        Mood and Affect: Mood normal.        Behavior: Behavior normal.      ED Treatments / Results  Labs (all labs ordered are listed, but only abnormal results are displayed) Labs Reviewed - No data to display  EKG None  Radiology Dg Chest 2 View  Result Date: 10/07/2018 CLINICAL DATA:  Fever and cough for 4 days. EXAM: CHEST - 2 VIEW COMPARISON:  09/19/2018 FINDINGS: The heart size and mediastinal contours are within normal limits. Both lungs are clear. The visualized skeletal structures are unremarkable. IMPRESSION: Negative.  No active cardiopulmonary disease. Electronically Signed   By: Myles RosenthalJohn  Stahl M.D.   On: 10/07/2018 17:50    Procedures Procedures (including critical care time)  Medications Ordered in ED Medications  ibuprofen (ADVIL,MOTRIN) tablet 600 mg (600 mg Oral Given 10/07/18 1608)     Initial Impression / Assessment and Plan / ED Course  I have reviewed the triage vital signs and the nursing notes.  Pertinent labs & imaging results that were available during my care of the patient were reviewed by me and considered in my medical decision making (see chart for details).     Patients symptoms are consistent with URI, likely viral etiology. Consider influenza. Febrile on arrival with tachycardia, temp 100.52F and HR 117; however with improvement after antipyretics and oral hydration to 99.59F and HR 99. Tolerating secretions.  Lungs clear to auscultation bilaterally. CXR negative for acute infiltrate.  Discussed that antibiotics are not indicated for viral infections. Pt will be discharged with symptomatic treatment.  Verbalizes understanding and is agreeable with plan. Pt is  hemodynamically stable & in NAD prior to dc.  Discussed results, findings, treatment and follow up. Patient advised of return precautions. Patient verbalized understanding and agreed with plan.  Final Clinical Impressions(s) / ED Diagnoses   Final diagnoses:  Viral URI with cough    ED Discharge Orders    None       [place], SwazilandJordan N, PA-C 10/07/18 1813    Linwood DibblesKnapp, Jon, MD 10/08/18 1459

## 2019-01-15 ENCOUNTER — Encounter: Payer: Self-pay | Admitting: Cardiology

## 2019-01-16 NOTE — Progress Notes (Signed)
Subjective:  Primary Physician:  Iona Hansen, NP  Patient ID: Samantha Tapia, female    DOB: [birth year], 33 y.o.   MRN: [id number]  This visit type was conducted due to national recommendations for restrictions regarding the COVID-19 Pandemic (e.g. social distancing).  This format is felt to be most appropriate for this patient at this time.  All issues noted in this document were discussed and addressed.  No physical exam was performed (except for noted visual exam findings with Telehealth visits - very limited).  The patient has consented to conduct a Telehealth visit and understands insurance will be billed.   I connected with patient, on 01/19/19  by a video enabled telemedicine application and verified that I am speaking with the correct person using two identifiers.     I discussed the limitations of evaluation and management by telemedicine and the availability of in person appointments. The patient expressed understanding and agreed to proceed.   I have discussed with patient regarding the safety during COVID Pandemic and steps and precautions including social distancing with the patient.    Chief Complaint  Patient presents with  . Palpitations  . Follow-up    HPI: Samantha Tapia  is a 33 y.o. female . She has mild to moderate mitral regurgitation by echocardiogram in January 2020.  Patient had h/o palpitation and flutters lasting for a few seconds. Palpitations were more frequent when she had a viral respiratory infection. Symptoms have resolved now with metoprolol therapy. There is no dizziness, near-syncope or syncope.  Patient denies any chest pain, tightness or pressure. No complaints of shortness of breath, orthopnea or PND. No history of swelling on the legs.  No history of hypertension or diabetes. She does not know about her serum cholesterol level. She does not smoke. Patient has history of iron deficiency anemia for many years. No history of rheumatic fever.   She drinks gren tea occasionally. No history of taking any decongestants medications.    Past Medical History:  Diagnosis Date  . Anemia   . Atypical chest pain   . Fatigue   . Heart murmur   . Mitral regurgitation   . Palpitations     History reviewed. No pertinent surgical history.  Social History   Socioeconomic History  . Marital status: Single    Spouse name: Not on file  . Number of children: 0  . Years of education: Not on file  . Highest education level: Not on file  Occupational History  . Not on file  Social Needs  . Financial resource strain: Not on file  . Food insecurity:    Worry: Not on file    Inability: Not on file  . Transportation needs:    Medical: Not on file    Non-medical: Not on file  Tobacco Use  . Smoking status: Never Smoker  . Smokeless tobacco: Never Used  Substance and Sexual Activity  . Alcohol use: Yes    Alcohol/week: 0.0 standard drinks    Comment: OCC  . Drug use: Never  . Sexual activity: Not on file  Lifestyle  . Physical activity:    Days per week: Not on file    Minutes per session: Not on file  . Stress: Not on file  Relationships  . Social connections:    Talks on phone: Not on file    Gets together: Not on file    Attends religious service: Not on file    Active member of club or  organization: Not on file    Attends meetings of clubs or organizations: Not on file    Relationship status: Not on file  . Intimate partner violence:    Fear of current or ex partner: Not on file    Emotionally abused: Not on file    Physically abused: Not on file    Forced sexual activity: Not on file  Other Topics Concern  . Not on file  Social History Narrative  . Not on file    Current Outpatient Medications on File Prior to Visit  Medication Sig Dispense Refill  . benzonatate (TESSALON) 100 MG capsule Take 1 capsule (100 mg total) by mouth every 8 (eight) hours. 21 capsule 0  . ferrous sulfate 325 (65 FE) MG tablet Take  325 mg by mouth daily with breakfast.    . fluticasone (FLONASE) 50 MCG/ACT nasal spray Place 1 spray into both nostrils daily. (Patient taking differently: Place 1 spray into both nostrils daily as needed for allergies. ) 16 g 0  . metoprolol tartrate (LOPRESSOR) 25 MG tablet Take 25 mg by mouth daily.    . Multiple Vitamin (MULTIVITAMIN WITH MINERALS) TABS tablet Take 1 tablet by mouth daily.    . naproxen (NAPROSYN) 500 MG tablet Take 1 tablet (500 mg total) by mouth 2 (two) times daily. 10 tablet 0  . Probiotic Product (PROBIOTIC-10) CAPS Take 1 capsule by mouth as needed.     No current facility-administered medications on file prior to visit.     Review of Systems  Constitutional: Negative for fever.  HENT: Negative for nosebleeds.   Eyes: Negative for blurred vision.  Respiratory: Negative for cough and shortness of breath.   Cardiovascular: Negative for palpitations and leg swelling.  Gastrointestinal: Negative for abdominal pain, nausea and vomiting.  Genitourinary: Negative for dysuria.  Musculoskeletal: Negative for myalgias.  Skin: Negative for itching and rash.  Neurological: Negative for dizziness, seizures and loss of consciousness.  Psychiatric/Behavioral: The patient is not nervous/anxious.        Objective:  Pulse 90. There is no height or weight on file to calculate BMI. patient told me that she had  blood pressure check at physician's office 2 months ago and it was told to be normal.  She does not have blood pressure instrument at home.  Physical Exam  Patient is alert and oriented, she appeared very comfortable at the time of visit.  No further examination was possible as it was a telemedicine visit.  CARDIAC STUDIES:   Echo- 10/17/2018 1. Left ventricle cavity is normal in size. Normal global: wall motion. Calculated EF 68%. 2. Mild to moderate mitral regurgitation. Mild mitral valve leaflet thickening. Borderline prolapse of the mitral valve leaflets. 3.  Mild tricuspid regurgitation. No evidence of pulmonary hypertension. 4. IVC is dilated with respiratory variation. Findings suggest elevated CVP.  Treadmill stress test: 05/28/2014 Indications: Palpitations. Conclusions: Normal Test. The patient exercised according to the [facility] protocol, Total time recorded .0sec. achieving a max heart rate of 187 which was 97% of MPHR for age and 8.2 METS of work. The baseline ECG showed NSR,IRBBB. During exercise there was no ST-T changes of ischemia. No inducible arrhtymias. Normal BP. Normal exercise tolerence. Symptoms: Fatigue. Achieved 98% MPHR. Arrhythmia: None. Continue primary prevention.  Assessment & Recommendations:   1. Mild mitral regurgitation  2. Palpitation  Laboratory Exam:  CBC Latest Ref Rng & Units 09/19/2018 05/28/2010  WBC 4.0 - 10.5 K/uL 8.5 14.1(H)  Hemoglobin 12.0 - 15.0 g/dL 16.1(W)  9.5(L)  Hematocrit 36.0 - 46.0 % 36.7 28.0(L)  Platelets 150 - 400 K/uL 427(H) 254   CMP Latest Ref Rng & Units 09/19/2018 05/28/2010  Glucose 70 - 99 mg/dL 89 161(W113(H)  BUN 6 - 20 mg/dL 13 11  Creatinine 9.600.44 - 1.00 mg/dL 4.540.75 0.980.88  Sodium 119135 - 145 mmol/L 138 135  Potassium 3.5 - 5.1 mmol/L 3.8 3.4(L)  Chloride 98 - 111 mmol/L 102 107  CO2 22 - 32 mmol/L 24 24  Calcium 8.9 - 10.3 mg/dL 9.6 8.7  Total Protein 6.0 - 8.3 g/dL - 7.1  Total Bilirubin 0.3 - 1.2 mg/dL - 1.1  Alkaline Phos 39 - 117 U/L - 36(L)  AST 0 - 37 U/L - 24  ALT 0 - 35 U/L - 12   Lipid Panel  No results found for: CHOL, TRIG, HDL, CHOLHDL, VLDL, LDLCALC, LDLDIRECT   Recommendation:  She has symptoms of palpitation which appeared to be due to premature beats. There was no dizziness or near syncope. Symptoms have resolved with metoprolol therapy and patient is not feeling any more palpitation.  Primary prevention was again discussed with the patient. Follow low-salt, low-cholesterol diet and was advised to exercise regularly. She was also advised to avoid  caffeine, decongestant medications or any other stimulants.  She will return for follow-up after 6 months, but, call us if there are any cardiac problems in the interim.  Earl Manyhandra K Jaimya Feliciano, MD, [facility] Hospital[facility]ACC 01/19/2019, 10:06 AM [facility] Pager: [phone] Office: [phone] If no answer Cell [phone]

## 2019-01-19 ENCOUNTER — Other Ambulatory Visit: Payer: Self-pay

## 2019-01-19 ENCOUNTER — Ambulatory Visit (INDEPENDENT_AMBULATORY_CARE_PROVIDER_SITE_OTHER): Payer: 59 | Admitting: Cardiology

## 2019-01-19 ENCOUNTER — Encounter: Payer: Self-pay | Admitting: Cardiology

## 2019-01-19 VITALS — HR 90

## 2019-01-19 DIAGNOSIS — I10 Essential (primary) hypertension: Secondary | ICD-10-CM | POA: Insufficient documentation

## 2019-01-19 DIAGNOSIS — I340 Nonrheumatic mitral (valve) insufficiency: Secondary | ICD-10-CM | POA: Diagnosis not present

## 2019-01-19 DIAGNOSIS — R002 Palpitations: Secondary | ICD-10-CM

## 2019-04-01 IMAGING — CR DG CHEST 2V
2 series · 2 of 2 positions shown · non-contrast
Comparison: 09/19/2018

CLINICAL DATA: Fever and cough for 4 days.

EXAM:
CHEST - 2 VIEW

[w chest pa]
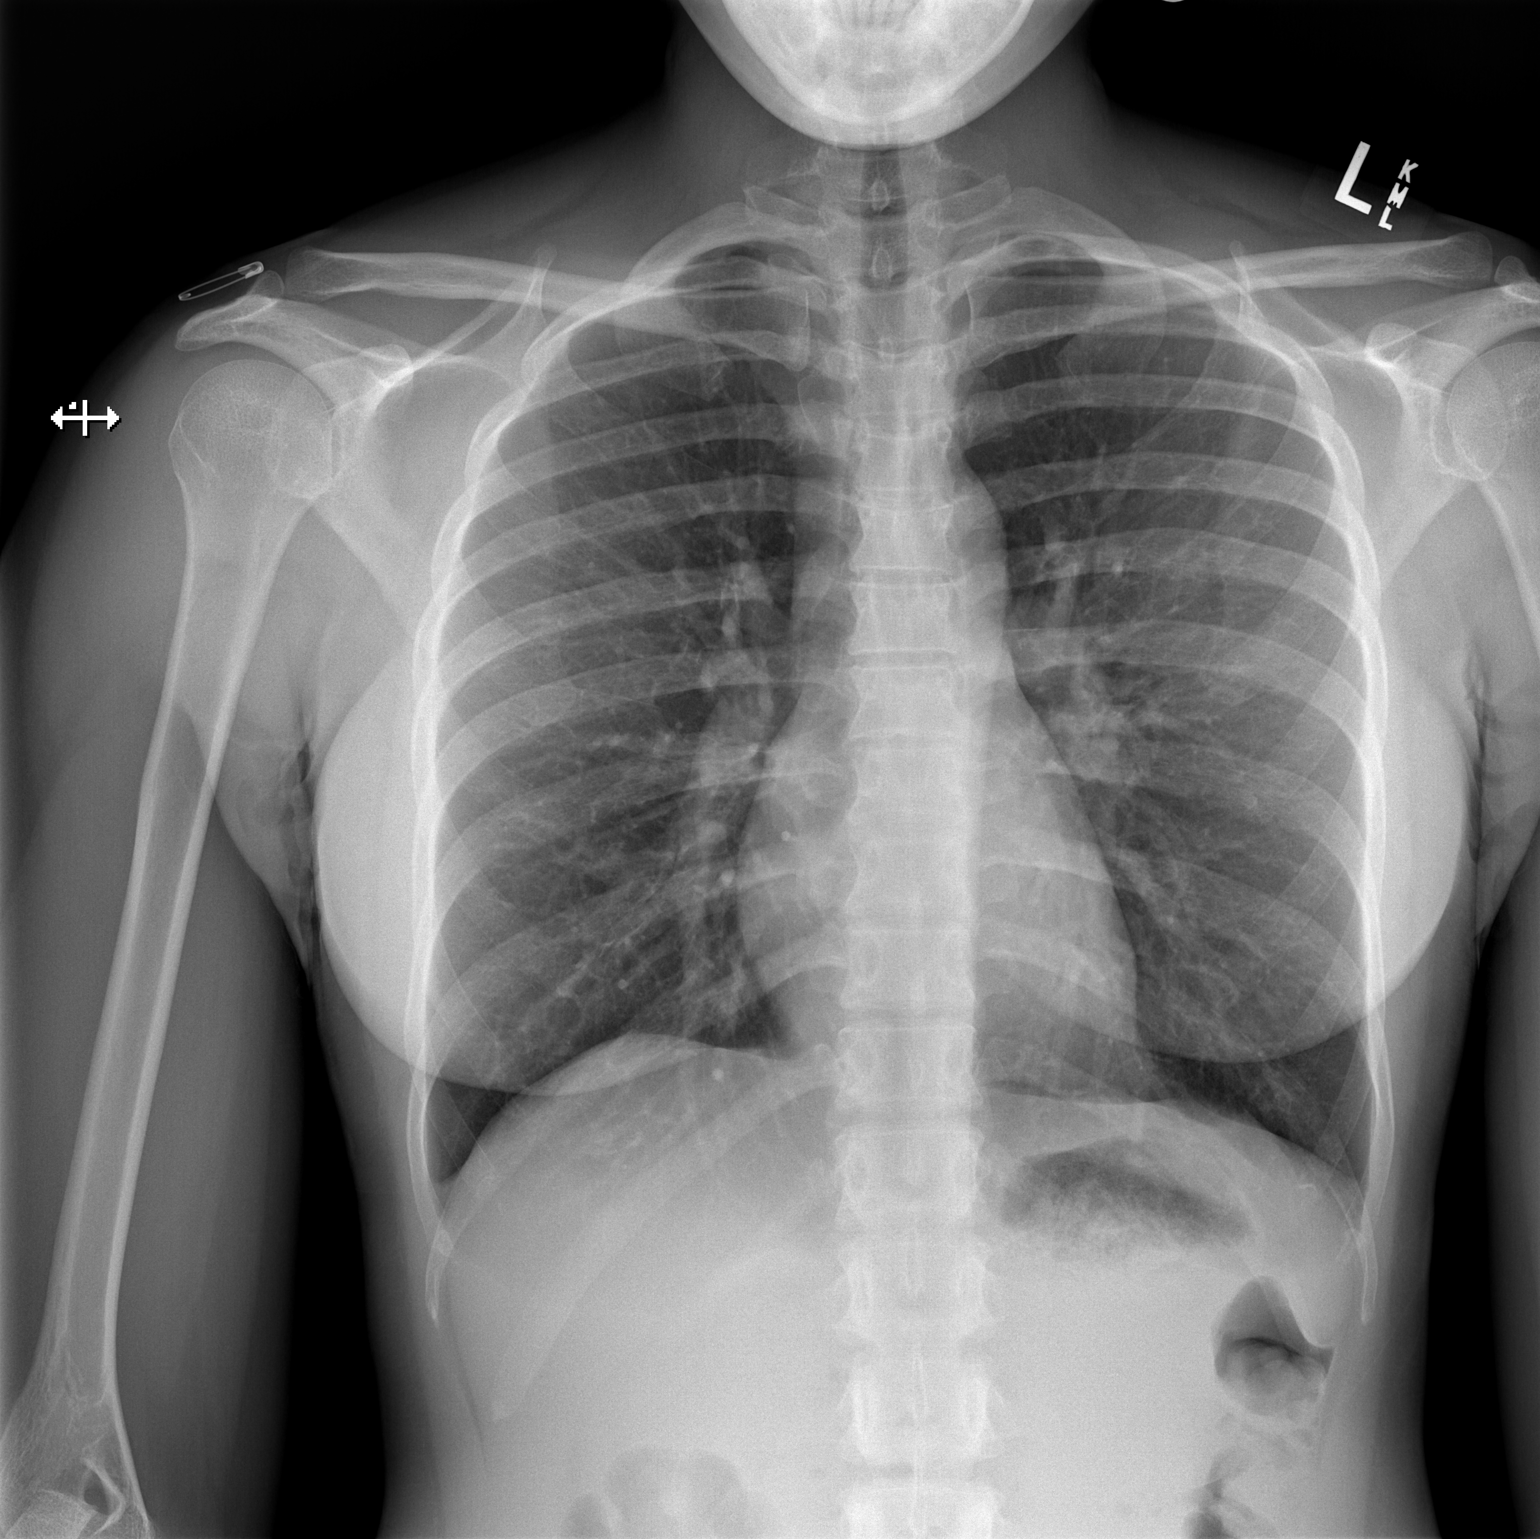

[w chest lat]
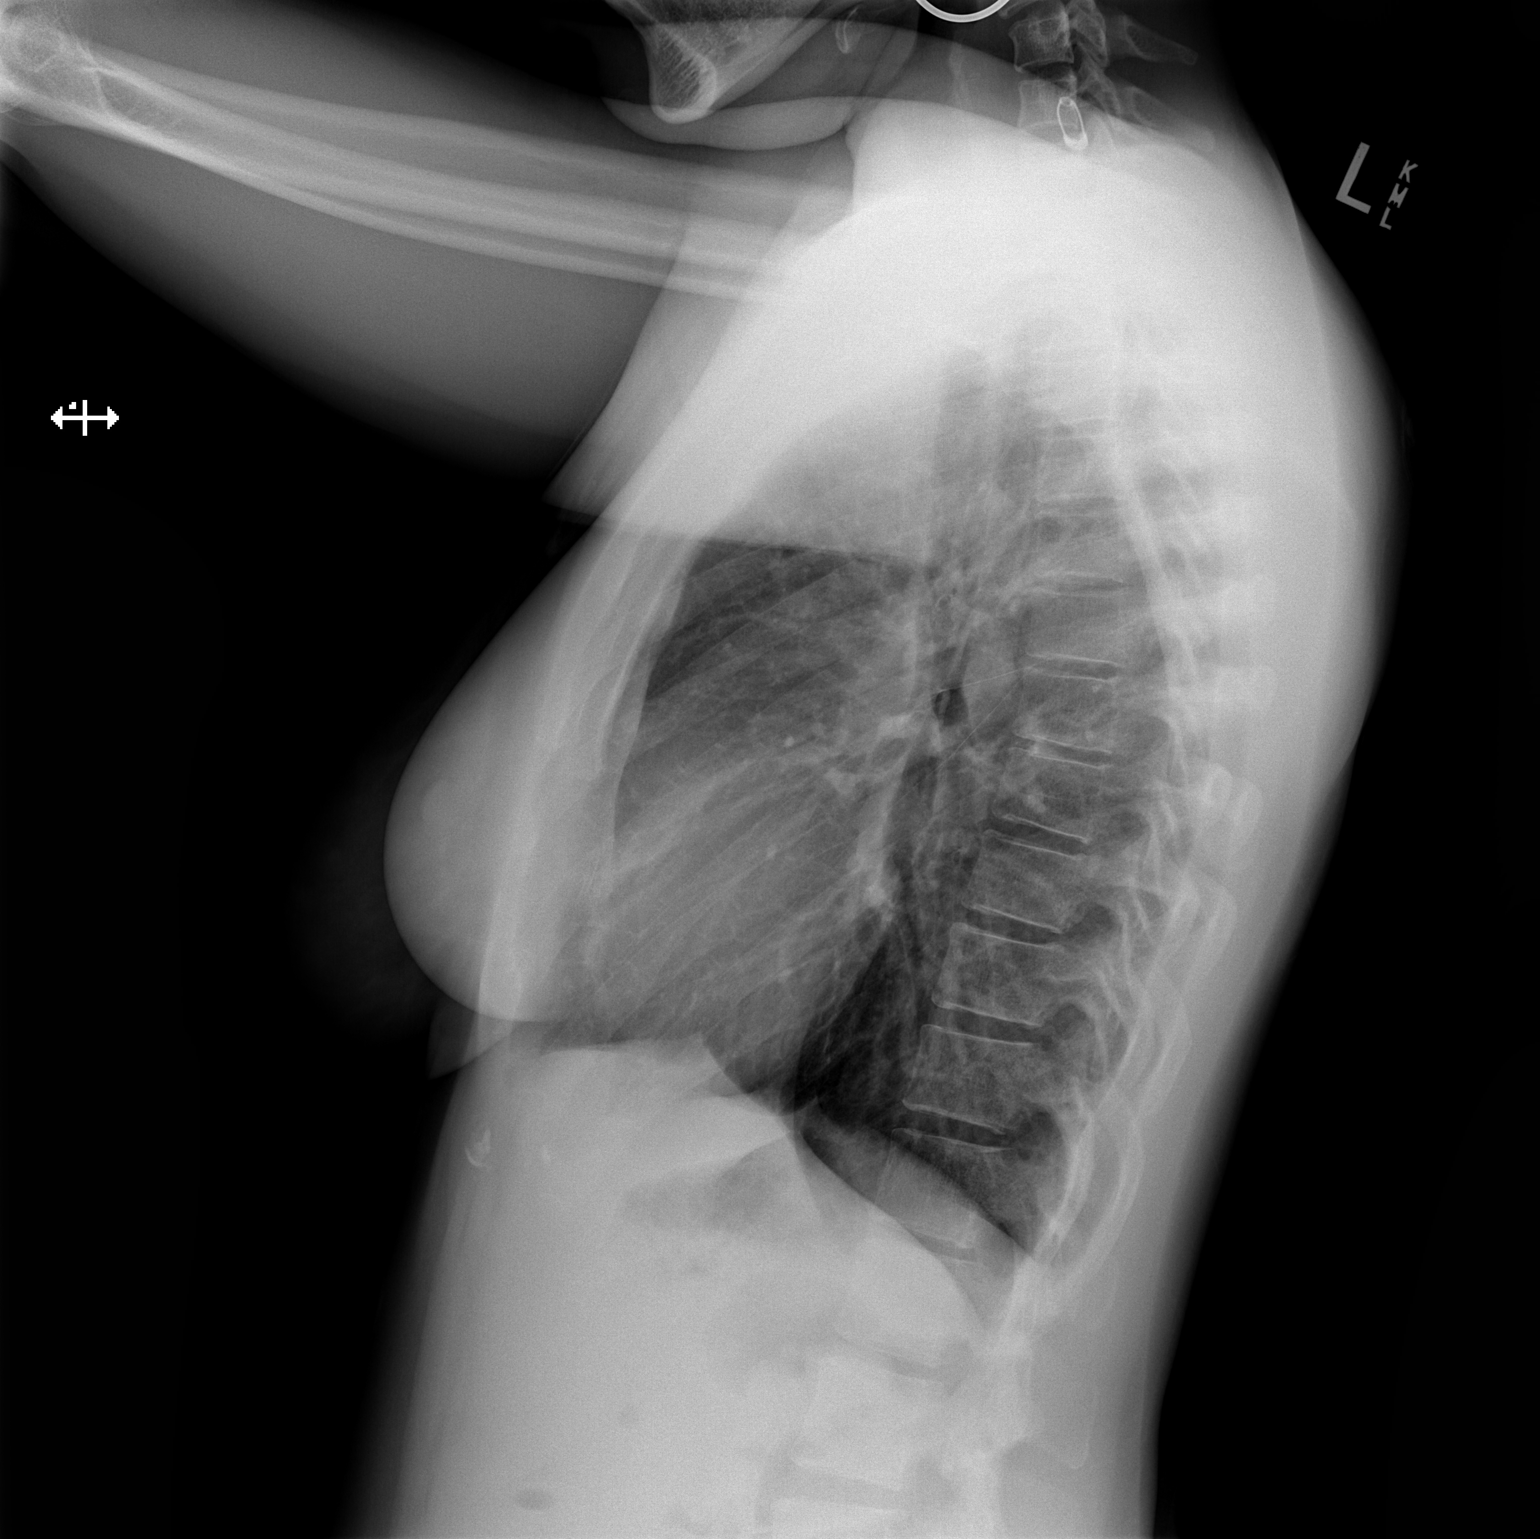

[2 of 2 positions shown; findings below may reference images not displayed]

FINDINGS: The heart size and mediastinal contours are within normal limits.
Both lungs are clear. The visualized skeletal structures are
unremarkable.
IMPRESSION: Negative.  No active cardiopulmonary disease.

## 2019-05-08 ENCOUNTER — Telehealth: Payer: Self-pay

## 2019-05-08 NOTE — Telephone Encounter (Signed)
Pt called and wants to know if she suppose to keep taking the metoprolol she is out of refills and wants to know what you want her to do

## 2019-05-08 NOTE — Telephone Encounter (Signed)
Pt aware.

## 2019-07-06 ENCOUNTER — Ambulatory Visit: Payer: 59 | Admitting: Cardiology

## 2019-07-06 DIAGNOSIS — Z5329 Procedure and treatment not carried out because of patient's decision for other reasons: Secondary | ICD-10-CM

## 2019-07-06 NOTE — Progress Notes (Deleted)
Primary Physician:  Berkley Harvey, NP   Patient ID: Samantha Tapia, female    DOB: 05/12/1986, 33 y.o.   MRN: [id number]  Subjective:    No chief complaint on file.   HPI: Samantha Tapia  is a 33 y.o. female  with mild to moderate mitral regurgitation by echocardiogram in January 2020 and history of iron deficiency anemia for many years.    Patient had h/o palpitation and flutters lasting for a few seconds. Palpitations were more frequent when she had a viral respiratory infection. Symptoms have resolved now with metoprolol therapy. There is no dizziness, near-syncope or syncope.  Patient denies any chest pain, tightness or pressure. No complaints of shortness of breath, orthopnea or PND. No history of swelling on the legs.  No history of hypertension or diabetes. She does not smoke. No history of rheumatic fever.  She drinks gren tea occasionally. No history of taking any decongestants medications.   Past Medical History:  Diagnosis Date  . Anemia   . Atypical chest pain   . Fatigue   . Heart murmur   . Mitral regurgitation   . Palpitations     No past surgical history on file.  Social History   Socioeconomic History  . Marital status: Single    Spouse name: Not on file  . Number of children: 0  . Years of education: Not on file  . Highest education level: Not on file  Occupational History  . Not on file  Social Needs  . Financial resource strain: Not on file  . Food insecurity    Worry: Not on file    Inability: Not on file  . Transportation needs    Medical: Not on file    Non-medical: Not on file  Tobacco Use  . Smoking status: Never Smoker  . Smokeless tobacco: Never Used  Substance and Sexual Activity  . Alcohol use: Yes    Alcohol/week: 0.0 standard drinks    Comment: OCC  . Drug use: Never  . Sexual activity: Not on file  Lifestyle  . Physical activity    Days per week: Not on file    Minutes per session: Not on file  . Stress: Not  on file  Relationships  . Social Herbalist on phone: Not on file    Gets together: Not on file    Attends religious service: Not on file    Active member of club or organization: Not on file    Attends meetings of clubs or organizations: Not on file    Relationship status: Not on file  . Intimate partner violence    Fear of current or ex partner: Not on file    Emotionally abused: Not on file    Physically abused: Not on file    Forced sexual activity: Not on file  Other Topics Concern  . Not on file  Social History Narrative  . Not on file    ROS    Objective:  There were no vitals taken for this visit. There is no height or weight on file to calculate BMI.    Physical Exam Radiology: No results found.  Laboratory examination:   *** CMP Latest Ref Rng & Units 09/19/2018 05/28/2010  Glucose 70 - 99 mg/dL 89 113(H)  BUN 6 - 20 mg/dL 13 11  Creatinine 0.44 - 1.00 mg/dL 0.75 0.88  Sodium 135 - 145 mmol/L 138 135  Potassium 3.5 - 5.1 mmol/L 3.8 3.4(L)  Chloride  98 - 111 mmol/L 102 107  CO2 22 - 32 mmol/L 24 24  Calcium 8.9 - 10.3 mg/dL 9.6 8.7  Total Protein 6.0 - 8.3 g/dL - 7.1  Total Bilirubin 0.3 - 1.2 mg/dL - 1.1  Alkaline Phos 39 - 117 U/L - 36(L)  AST 0 - 37 U/L - 24  ALT 0 - 35 U/L - 12   CBC Latest Ref Rng & Units 09/19/2018 05/28/2010  WBC 4.0 - 10.5 K/uL 8.5 14.1(H)  Hemoglobin 12.0 - 15.0 g/dL 11.7(L) 9.5(L)  Hematocrit 36.0 - 46.0 % 36.7 28.0(L)  Platelets 150 - 400 K/uL 427(H) 254   Lipid Panel  No results found for: CHOL, TRIG, HDL, CHOLHDL, VLDL, LDLCALC, LDLDIRECT HEMOGLOBIN A1C No results found for: HGBA1C, MPG TSH No results for input(s): TSH in the last 8760 hours.  PRN Meds:. There are no discontinued medications. No outpatient medications have been marked as taking for the 07/06/19 encounter (Appointment) with Toniann Fail, NP.    Cardiac Studies:   Echo- 10/17/2018 1. Left ventricle cavity is normal in size. Normal  global: wall motion. Calculated EF 68%. 2. Mild to moderate mitral regurgitation. Mild mitral valve leaflet thickening. Borderline prolapse of the mitral valve leaflets. 3. Mild tricuspid regurgitation. No evidence of pulmonary hypertension. 4. IVC is dilated with respiratory variation. Findings suggest elevated CVP.  Treadmill stress test: 05/28/2014 Indications: Palpitations. Conclusions: Normal Test. The patient exercised according to the Bruce protocol, Total time recorded .0sec. achieving a max heart rate of 187 which was 97% of MPHR for age and 8.2 METS of work. The baseline ECG showed NSR,IRBBB. During exercise there was no ST-T changes of ischemia. No inducible arrhtymias. Normal BP. Normal exercise tolerence. Symptoms: Fatigue. Achieved 98% MPHR. Arrhythmia: None. Continue primary prevention.  Event Monitor- Aug-Sept, 2015- Periods of Sinus Tach., max. HR- 179/min. No other arrhythmias. Treadmill stress test. 05/28/2014 Indications: Palpitations. Conclusions: Normal Test. The patient exercised according to the Bruce protocol, Total time recorded 7 Min. 0 sec. achieving a max heart rate of 187 which was 97% of MPHR for age and 8.2 METS of work. The baseline ECG showed NSR,IRBBB. During exercise there was no ST-T changes of ischemia. No inducible arrhtymias. Normal BP. Normal exercise tolerence. Symptoms: Fatigue. Achieved 98% MPHR. Arrhythmia: None. Continue primary prevention.  Assessment:   No diagnosis found.  ***  Recommendations:   ***  Toniann Fail, MSN, APRN, FNP-C [facility] Office: [phone] Fax: [phone]

## 2020-01-01 ENCOUNTER — Ambulatory Visit: Payer: 59 | Attending: Internal Medicine

## 2020-01-01 ENCOUNTER — Ambulatory Visit: Payer: 59

## 2020-01-01 DIAGNOSIS — Z23 Encounter for immunization: Secondary | ICD-10-CM

## 2020-01-01 NOTE — Progress Notes (Signed)
   Covid-19 Vaccination Clinic  Name:  Samantha Tapia    MRN: [id number] DOB: 12/25/1985  01/01/2020  Samantha Tapia was observed post Covid-19 immunization for 15 minutes without incident. She was provided with Vaccine Information Sheet and instruction to access the V-Safe system.   Samantha Tapia was instructed to call 911 with any severe reactions post vaccine: Marland Kitchen Difficulty breathing  . Swelling of face and throat  . A fast heartbeat  . A bad rash all over body  . Dizziness and weakness   Immunizations Administered    Name Date Dose VIS Date Route   Pfizer COVID-19 Vaccine 01/01/2020  2:41 PM 0.3 mL 09/11/2019 Intramuscular   Manufacturer: [employer], [employer]   Lot: [id number]   NDC: [address]-5

## 2020-01-26 ENCOUNTER — Ambulatory Visit: Payer: 59 | Attending: Internal Medicine

## 2020-01-26 DIAGNOSIS — Z23 Encounter for immunization: Secondary | ICD-10-CM

## 2020-01-26 NOTE — Progress Notes (Signed)
   Covid-19 Vaccination Clinic  Name:  Samantha Tapia    MRN: [id number] DOB: 10-Nov-1985  01/26/2020  [name] was observed post Covid-19 immunization for 30 minutes based on pre-vaccination screening without incident. She was provided with Vaccine Information Sheet and instruction to access the V-Safe system.   [name] was instructed to call 911 with any severe reactions post vaccine: Marland Kitchen Difficulty breathing  . Swelling of face and throat  . A fast heartbeat  . A bad rash all over body  . Dizziness and weakness   Immunizations Administered    Name Date Dose VIS Date Route   Pfizer COVID-19 Vaccine 01/26/2020  4:47 PM 0.3 mL 11/25/2018 Intramuscular   Manufacturer: [employer], [employer]   Lot: [id number]   NDC: [address]-4

## 2020-06-08 ENCOUNTER — Other Ambulatory Visit: Payer: Self-pay

## 2020-06-08 ENCOUNTER — Emergency Department (HOSPITAL_BASED_OUTPATIENT_CLINIC_OR_DEPARTMENT_OTHER): Payer: 59

## 2020-06-08 ENCOUNTER — Emergency Department (HOSPITAL_BASED_OUTPATIENT_CLINIC_OR_DEPARTMENT_OTHER)
Admission: EM | Admit: 2020-06-08 | Discharge: 2020-06-08 | Disposition: A | Discharge status: Home / Self Care | Payer: 59 | Attending: Emergency Medicine | Admitting: Emergency Medicine

## 2020-06-08 ENCOUNTER — Encounter (HOSPITAL_BASED_OUTPATIENT_CLINIC_OR_DEPARTMENT_OTHER): Payer: Self-pay | Admitting: *Deleted

## 2020-06-08 DIAGNOSIS — J069 Acute upper respiratory infection, unspecified: Secondary | ICD-10-CM | POA: Diagnosis present

## 2020-06-08 NOTE — Discharge Instructions (Signed)
Please refer to attached instructions. Follow-up with your PCP if symptoms do not improve over the next week.

## 2020-06-08 NOTE — ED Provider Notes (Signed)
[facility] EMERGENCY DEPARTMENT Provider Note   CSN: [id number] Arrival date & time: 06/08/20  1554     History Chief Complaint  Patient presents with  . URI    Samantha Tapia is a 34 y.o. female.  Patient presents with URI symptoms onset 4 days ago. She works at [employer] with limited public contact. She had received her COVID vaccine. No known exposure to COVID positive individuals. She adheres to recommended infection control guidelines.  The history is provided by the patient. No language interpreter was used.  URI Presenting symptoms: congestion, cough and sore throat   Presenting symptoms: no fever   Congestion:    Location:  Nasal   Interferes with sleep: no     Interferes with eating/drinking: no   Cough:    Sputum characteristics:  Green   Severity:  Mild   Duration:  4 days Severity:  Mild Progression:  Improving Chronicity:  New Associated symptoms: no myalgias   Risk factors: no recent illness and no sick contacts        Past Medical History:  Diagnosis Date  . Anemia   . Atypical chest pain   . Fatigue   . Heart murmur   . Mitral regurgitation   . Palpitations     There are no problems to display for this patient.   History reviewed. No pertinent surgical history.   OB History   No obstetric history on file.     Family History  Problem Relation Age of Onset  . Heart failure Mother   . Diabetes Father   . Heart murmur Brother     Social History   Tobacco Use  . Smoking status: Never Smoker  . Smokeless tobacco: Never Used  Vaping Use  . Vaping Use: Never used  Substance Use Topics  . Alcohol use: Yes    Alcohol/week: 0.0 standard drinks    Comment: OCC  . Drug use: Never    Home Medications Prior to Admission medications   Medication Sig Start Date End Date Taking? Authorizing Provider  benzonatate (TESSALON) 100 MG capsule Take 1 capsule (100 mg total) by mouth every 8 (eight) hours. 09/06/18   Petrucelli, Samantha  R, PA-C  ferrous sulfate 325 (65 FE) MG tablet Take 325 mg by mouth daily with breakfast.    [provider]  fluticasone (FLONASE) 50 MCG/ACT nasal spray Place 1 spray into both nostrils daily. Patient taking differently: Place 1 spray into both nostrils daily as needed for allergies.  09/06/18   Petrucelli, Samantha R, PA-C  metoprolol tartrate (LOPRESSOR) 25 MG tablet Take 25 mg by mouth daily.    [provider]  Multiple Vitamin (MULTIVITAMIN WITH MINERALS) TABS tablet Take 1 tablet by mouth daily.    [provider]  naproxen (NAPROSYN) 500 MG tablet Take 1 tablet (500 mg total) by mouth 2 (two) times daily. 09/06/18   Petrucelli, Samantha R, PA-C  Probiotic Product (PROBIOTIC-10) CAPS Take 1 capsule by mouth as needed.    [provider]    Allergies    Shellfish-derived products and Sulfur  Review of Systems   Review of Systems  Constitutional: Negative for chills and fever.  HENT: Positive for congestion and sore throat.   Respiratory: Positive for cough. Negative for shortness of breath.   Gastrointestinal: Negative for abdominal pain.  Musculoskeletal: Negative for myalgias.  Skin: Negative for rash.  All other systems reviewed and are negative.   Physical Exam Updated Vital Signs BP 129/86 (BP  Location: Left Arm)   Pulse (!) 111   Temp 98.9 F (37.2 C) (Oral)   Resp 18   Ht 5\' 4"  (1.626 m)   Wt 68 kg   LMP 05/19/2020   SpO2 100%   BMI 25.75 kg/m   Physical Exam Vitals and nursing note reviewed.  Constitutional:      Appearance: Normal appearance. She is not ill-appearing.  HENT:     Head: Normocephalic.     Nose: Congestion present.     Right Sinus: No maxillary sinus tenderness or frontal sinus tenderness.     Left Sinus: No maxillary sinus tenderness or frontal sinus tenderness.     Mouth/Throat:     Mouth: Mucous membranes are moist.     Pharynx: Oropharynx is clear.  Eyes:     Conjunctiva/sclera: Conjunctivae normal.   Cardiovascular:     Rate and Rhythm: Regular rhythm.  Pulmonary:     Effort: Pulmonary effort is normal.     Breath sounds: Normal breath sounds.  Abdominal:     Palpations: Abdomen is soft.  Musculoskeletal:        General: Normal range of motion.     Cervical back: Neck supple.  Lymphadenopathy:     Cervical: No cervical adenopathy.  Skin:    General: Skin is warm and dry.  Neurological:     Mental Status: She is alert and oriented to person, place, and time.  Psychiatric:        Mood and Affect: Mood normal.     ED Results / Procedures / Treatments   Labs (all labs ordered are listed, but only abnormal results are displayed) Labs Reviewed - No data to display  EKG None  Radiology DG Chest Portable 1 View  Result Date: 06/08/2020 CLINICAL DATA:  Cough EXAM: PORTABLE CHEST 1 VIEW COMPARISON:  10/07/2018 FINDINGS: The heart size and mediastinal contours are within normal limits. Both lungs are clear. The visualized skeletal structures are unremarkable. IMPRESSION: No active disease. Electronically Signed   By: 12/06/2018 M.D.   On: 06/08/2020 16:59    Procedures Procedures (including critical care time)  Medications Ordered in ED Medications - No data to display  ED Course  I have reviewed the triage vital signs and the nursing notes.  Pertinent labs & imaging results that were available during my care of the patient were reviewed by me and considered in my medical decision making (see chart for details).    MDM Rules/Calculators/A&P                         Pt symptoms consistent with URI. CXR negative for acute infiltrate. Pt will be discharged with symptomatic treatment.  Discussed return precautions.  Pt is hemodynamically stable & in NAD prior to discharge. Final Clinical Impression(s) / ED Diagnoses Final diagnoses:  Viral upper respiratory tract infection    Rx / DC Orders ED Discharge Orders    None       08/08/2020, NP 06/08/20 08/08/20      Herbie Baltimore, MD 06/11/20 1736

## 2020-06-08 NOTE — ED Triage Notes (Signed)
C/o uri symptoms x 4 days

## 2020-08-05 ENCOUNTER — Encounter: Payer: Self-pay | Admitting: Podiatry

## 2020-08-05 ENCOUNTER — Other Ambulatory Visit: Payer: Self-pay

## 2020-08-05 ENCOUNTER — Ambulatory Visit: Payer: 59

## 2020-08-05 ENCOUNTER — Ambulatory Visit: Payer: 59 | Admitting: Podiatry

## 2020-08-05 DIAGNOSIS — M79671 Pain in right foot: Secondary | ICD-10-CM

## 2020-08-05 DIAGNOSIS — M2042 Other hammer toe(s) (acquired), left foot: Secondary | ICD-10-CM | POA: Diagnosis not present

## 2020-08-05 DIAGNOSIS — M21612 Bunion of left foot: Secondary | ICD-10-CM

## 2020-08-05 DIAGNOSIS — M2012 Hallux valgus (acquired), left foot: Secondary | ICD-10-CM

## 2020-08-05 DIAGNOSIS — M2011 Hallux valgus (acquired), right foot: Secondary | ICD-10-CM | POA: Diagnosis not present

## 2020-08-05 DIAGNOSIS — M2041 Other hammer toe(s) (acquired), right foot: Secondary | ICD-10-CM

## 2020-08-05 DIAGNOSIS — M21611 Bunion of right foot: Secondary | ICD-10-CM
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
## 2020-08-23 ENCOUNTER — Ambulatory Visit (INDEPENDENT_AMBULATORY_CARE_PROVIDER_SITE_OTHER): Payer: 59 | Admitting: Podiatry

## 2020-08-23 ENCOUNTER — Other Ambulatory Visit: Payer: Self-pay

## 2020-08-23 ENCOUNTER — Ambulatory Visit (INDEPENDENT_AMBULATORY_CARE_PROVIDER_SITE_OTHER): Payer: 59

## 2020-08-23 DIAGNOSIS — M2142 Flat foot [pes planus] (acquired), left foot: Secondary | ICD-10-CM

## 2020-08-23 DIAGNOSIS — M2141 Flat foot [pes planus] (acquired), right foot: Secondary | ICD-10-CM | POA: Diagnosis not present

## 2020-08-23 DIAGNOSIS — M21611 Bunion of right foot: Secondary | ICD-10-CM | POA: Diagnosis not present

## 2020-08-23 DIAGNOSIS — M2011 Hallux valgus (acquired), right foot: Secondary | ICD-10-CM | POA: Diagnosis not present

## 2020-08-23 DIAGNOSIS — Q66211 Congenital metatarsus primus varus, right foot: Secondary | ICD-10-CM | POA: Diagnosis not present

## 2020-08-23 DIAGNOSIS — Q66221 Congenital metatarsus adductus, right foot: Secondary | ICD-10-CM

## 2020-08-24 ENCOUNTER — Encounter: Payer: Self-pay | Admitting: Podiatry

## 2020-08-24 NOTE — Progress Notes (Signed)
Subjective:  Patient ID: Samantha Tapia, female    DOB: 1985-12-09,  MRN: [id number]  Chief Complaint  Patient presents with  . Foot Pain    Right foot pain f/u Pt stated that her pain comes and goes but she has no other concerns. She brought her images from [place].     34 y.o. female returns with the above complaint. History confirmed with patient.  She returns with her x-ray CD from [facility] today. Objective:  Physical Exam: warm, good capillary refill, no trophic changes or ulcerative lesions, normal DP and PT pulses and normal sensory exam. Left Foot: Mild to moderate hallux valgus with deviation of the hallux and flexible hammertoe contracture second toe Right Foot: Moderate hallux valgus with deviation of hallux and flexible hammertoe contracture second toe  X-rays from [facility] reviewed unfortunate they are toe only films are nonweightbearing  New weightbearing radiographs taken today in the office of the right foot.  She has mild pes planus with hallux abductovalgus and met primus varus.  Mild metatarsus adductus deformity.  Increased hallux abductus angle. Assessment:   1. Hallux valgus with bunions, right   2. Pes planus of both feet   3. Metatarsus primus varus of right foot   4. Metatarsus adductus of right foot      Plan:  Patient was evaluated and treated and all questions answered.  I again discussed with her that her symptoms are likely related to the formation of a moderate bunion and she is getting most discomfort and neuritic symptoms from irritation of the medial dorsal cutaneous nerve in proper digital branch of the hallux over the bunion.  Discussed etiology and treatment options for bunions and hammertoes in detail.  I discussed with her that surgical correction could offer her relief, most of her pain is in shoe gears and with ambulation.  She tried wider shoes as well without relief.  She is interested in surgical correction.   We reviewed her radiographs in detail today.  We discussed bunion correction with both a distal osteotomy versus a Lapidus bunionectomy.  She does have mild elevatus of the first ray as well as metatarsus adductus.  I discussed with her that a Lapidus procedure could decrease the risk of recurrence but at the expense of an increased recovery period for this procedure.  I do think her deformity is mild enough that a distal osteotomy would be successful.  I discussed with her that a small incision percutaneous bunionectomy with a chevron and possible Akin osteotomy would offer both a good correction of the deformity as well as a preferable cosmetic result.  This also should offer less postoperative stiffness in the joint.  I discussed with her that although these are often marked as "minimally invasive bunion surgery", there is no such thing is minimally invasive foot surgery when discussing work on bones and use of metallic implants and that there certainly are still risk and potential complications.  We reviewed all these in detail.  All questions were addressed.  She would like to proceed with surgery at sometime in early spring around March.  Informed consent was signed and reviewed today and we will schedule surgery at her convenience as well as offer her information on what her out-of-pocket cost will be.   Surgical plan:  Procedure: -Bunionectomy right foot with chevron osteotomy, possible Akin osteotomy  Location: -[place]  Anesthesia plan: -IV sedation with local anesthesia  Postoperative pain plan: -  Tylenol 1000 mg every 6 hours, ibuprofen 600 mg every 6 hours, gabapentin 300 mg every 8 hours x5 days, oxycodone 5 mg 1-2 tabs every 6 hours only as needed  DVT prophylaxis: -None required she will be WBAT in a surgical shoe  WB Restrictions / DME needs: -We will dispense a surgical shoe at the surgery center   No follow-ups on file.

## 2020-10-26 ENCOUNTER — Telehealth: Payer: Self-pay

## 2020-10-26 NOTE — Telephone Encounter (Signed)
Thanks

## 2020-10-26 NOTE — Telephone Encounter (Signed)
Samantha Tapia called to cancel her surgery with Dr. Lilian Kapur on 12/02/2020. She stated her foot hasn't been hurting her since her appointment with Dr. Lilian Kapur and she wants to wait. I told her to let us know when she is ready to reschedule. Notified Dr. Lilian Kapur

## 2020-12-09 ENCOUNTER — Encounter: Payer: 59 | Admitting: Podiatry

## 2020-12-16 ENCOUNTER — Encounter: Payer: 59 | Admitting: Podiatry

## 2020-12-30 ENCOUNTER — Encounter: Payer: 59 | Admitting: Podiatry

## 2021-05-08 ENCOUNTER — Encounter (HOSPITAL_BASED_OUTPATIENT_CLINIC_OR_DEPARTMENT_OTHER): Payer: Self-pay

## 2021-05-08 ENCOUNTER — Emergency Department (HOSPITAL_BASED_OUTPATIENT_CLINIC_OR_DEPARTMENT_OTHER)
Admission: EM | Admit: 2021-05-08 | Discharge: 2021-05-08 | Disposition: A | Discharge status: Home / Self Care | Payer: 59 | Attending: Emergency Medicine | Admitting: Emergency Medicine

## 2021-05-08 ENCOUNTER — Other Ambulatory Visit: Payer: Self-pay

## 2021-05-08 DIAGNOSIS — M25512 Pain in left shoulder: Secondary | ICD-10-CM | POA: Diagnosis not present

## 2021-05-08 DIAGNOSIS — Y92410 Unspecified street and highway as the place of occurrence of the external cause: Secondary | ICD-10-CM | POA: Diagnosis not present

## 2021-05-08 DIAGNOSIS — M542 Cervicalgia: Secondary | ICD-10-CM | POA: Diagnosis present

## 2021-05-08 DIAGNOSIS — M545 Low back pain, unspecified: Secondary | ICD-10-CM | POA: Diagnosis not present

## 2021-05-08 DIAGNOSIS — S39012A Strain of muscle, fascia and tendon of lower back, initial encounter: Secondary | ICD-10-CM

## 2021-05-08 DIAGNOSIS — S161XXA Strain of muscle, fascia and tendon at neck level, initial encounter: Secondary | ICD-10-CM

## 2021-05-08 MED ORDER — IBUPROFEN 800 MG PO TABS: 800.0000 mg | ORAL_TABLET | Freq: Four times a day (QID) | ORAL | 0 refills | Status: AC | PRN

## 2021-05-08 MED ORDER — CYCLOBENZAPRINE HCL 5 MG PO TABS: 5.0000 mg | ORAL_TABLET | Freq: Three times a day (TID) | ORAL | 0 refills | Status: DC | PRN
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
## 2021-05-08 NOTE — ED Notes (Signed)
Pt discharged home after verbalizing understanding of discharge instructions; nad noted. 
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
## 2021-12-13 ENCOUNTER — Ambulatory Visit: Payer: 59 | Attending: Nurse Practitioner | Admitting: Physical Therapy

## 2021-12-13 ENCOUNTER — Other Ambulatory Visit: Payer: Self-pay

## 2021-12-13 ENCOUNTER — Encounter: Payer: Self-pay | Admitting: Physical Therapy

## 2021-12-13 DIAGNOSIS — G8929 Other chronic pain: Secondary | ICD-10-CM | POA: Diagnosis present

## 2021-12-13 DIAGNOSIS — M25512 Pain in left shoulder: Secondary | ICD-10-CM | POA: Diagnosis present

## 2021-12-13 DIAGNOSIS — R293 Abnormal posture: Secondary | ICD-10-CM | POA: Diagnosis present

## 2021-12-13 DIAGNOSIS — M6281 Muscle weakness (generalized): Secondary | ICD-10-CM | POA: Insufficient documentation

## 2021-12-13 DIAGNOSIS — M5412 Radiculopathy, cervical region: Secondary | ICD-10-CM | POA: Insufficient documentation
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
## 2021-12-21 ENCOUNTER — Encounter: Payer: Self-pay | Admitting: Physical Therapy

## 2021-12-21 ENCOUNTER — Other Ambulatory Visit: Payer: Self-pay

## 2021-12-21 ENCOUNTER — Ambulatory Visit: Payer: 59 | Admitting: Physical Therapy

## 2021-12-21 DIAGNOSIS — M6281 Muscle weakness (generalized): Secondary | ICD-10-CM

## 2021-12-21 DIAGNOSIS — M25512 Pain in left shoulder: Secondary | ICD-10-CM | POA: Diagnosis not present

## 2021-12-21 DIAGNOSIS — M5412 Radiculopathy, cervical region: Secondary | ICD-10-CM

## 2021-12-21 DIAGNOSIS — G8929 Other chronic pain: Secondary | ICD-10-CM

## 2021-12-21 DIAGNOSIS — R293 Abnormal posture: Secondary | ICD-10-CM

## 2021-12-21 NOTE — Therapy (Signed)
[place] ?[address] ?[place], [place], [address] ?Phone: [phone]   Fax:  [phone] ? ?Physical Therapy Treatment ? ?Patient Details  ?Name: Samantha Tapia ?MRN: [id number] ?Date of Birth: [birth year] ?Referring Provider (PT): Eldridge Abrahams ? ? ?Encounter Date: 12/21/2021 ? ? PT End of Session - 12/21/21 1342   ? ? Visit Number 2   ? Date for PT Re-Evaluation 02/07/22   ? PT Start Time 1310   ? PT Stop Time [id number]   ? PT Time Calculation (min) 35 min   ? Activity Tolerance Patient tolerated treatment well   ? Behavior During Therapy [facility] for tasks assessed/performed   ? ?  ?  ? ?  ? ? ?Past Medical History:  ?Diagnosis Date  ? Anemia   ? Atypical chest pain   ? Fatigue   ? Heart murmur   ? Mitral regurgitation   ? Palpitations   ? ? ?History reviewed. No pertinent surgical history. ? ?There were no vitals filed for this visit. ? ? Subjective Assessment - 12/21/21 1311   ? ? Subjective "Its ok, not too bad" Its good today   ? Currently in Pain? No/denies   ? ?  ?  ? ?  ? ? ? ? ? ? ? ? ? ? ? ? ? ? ? ? ? ? ? ? [facility] Adult PT Treatment/Exercise - 12/21/21 0001   ? ?  ? Exercises  ? Exercises Neck;Shoulder   ?  ? Neck Exercises: Machines for Strengthening  ? UBE (Upper Arm Bike) L1.9 x 2.5  min each   ?  ? Neck Exercises: Standing  ? Other Standing Exercises Tricep Ext 20lb 2x10   ?  ? Shoulder Exercises: Standing  ? External Rotation Strengthening;Both;20 reps;Theraband   ? Theraband Level (Shoulder External Rotation) Level 2 (Red)   ? Flexion 20 reps;Both;Strengthening   ? Shoulder Flexion Weight (lbs) 3   ? Extension Strengthening;20 reps;Theraband   ? Theraband Level (Shoulder Extension) Level 2 (Red)   ? Row Strengthening;Both;20 reps;Theraband   ? Theraband Level (Shoulder Row) Level 2 (Red)   ? Other Standing Exercises shrugs with rev rolls 2x10   ? Other Standing Exercises OHP yellow ball 2x10   ? ?  ?  ? ?  ? ? ? ? ? ? ? ? ? ? ? ? PT Short Term Goals -  12/13/21 1428   ? ?  ? PT SHORT TERM GOAL #1  ? Title I with basic HEP   ? Time 4   ? Period Weeks   ? Status New   ? Target Date 01/10/22   ?  ? PT SHORT TERM GOAL #2  ? Title Patient will report delay in her symptom onset during sitting to > 45 minutes.   ? Baseline < 30 nminutes   ? Time 4   ? Period Weeks   ? Status New   ? Target Date 01/10/22   ?  ? PT SHORT TERM GOAL #3  ? Title Patient will report delayed tingling in LUE with shoulder elevation > 90 degrees to > 5 minutes.   ? Baseline 1-2   ? Time 4   ? Period Weeks   ? Status New   ? Target Date 01/10/22   ? ?  ?  ? ?  ? ? ? ? PT Long Term Goals - 12/13/21 1430   ? ?  ? PT LONG TERM GOAL #1  ? Title  I with final HEP   ? Time 8   ? Period Weeks   ? Status New   ? Target Date 02/07/22   ?  ? PT LONG TERM GOAL #2  ? Title Patient will be able to sit and work x 4 hours, with rest breaks of 5 minutes every 30 minutes for stretch and re-position, with pain < 4/10   ? Time 8   ? Period Weeks   ? Status New   ? Target Date 02/07/22   ?  ? PT LONG TERM GOAL #3  ? Title Patient will verbalize/demosntrate adjustments to her workspace to improve her trunk suppport, posture.   ? Time 8   ? Period Weeks   ? Status New   ? Target Date 02/07/22   ?  ? PT LONG TERM GOAL #4  ? Title Patient will be able to work with her shoulders elevated > 90 x at least 10 minutes without tingling or burning in her L ant shoulder.   ? Baseline 1-2 minutes   ? Time 8   ? Period Weeks   ? Status New   ? Target Date 02/07/22   ? ?  ?  ? ?  ? ? ? ? ? ? ? ? Plan - 12/21/21 1342   ? ? Clinical Impression Statement Pt ~ 10 minutes late for today's session. She enters clinic feeling fine with will symptoms. Pt tolerated ain initial progression to TE well evident by no subjective reports of increase pain. Bilateral UE fatigue reported with standing shoulder flexion and abduction. [place] cue to squeeze shoulder blades together with external rotation.   ? Personal Factors and Comorbidities  Past/Current Experience   ? Examination-Activity Limitations Reach Overhead;Hygiene/Grooming;Lift;Carry;Caring for Others;Bathing;Dressing   ? Examination-Participation Restrictions Occupation   ? Stability/Clinical Decision Making Stable/Uncomplicated   ? Rehab Potential Good   ? PT Frequency 2x / week   ? PT Duration 8 weeks   ? PT Treatment/Interventions ADLs/Self Care Home Management;Electrical Stimulation;Cryotherapy;Neuromuscular re-education;Manual techniques;Dry needling;Therapeutic exercise;Passive range of motion;Therapeutic activities;Functional mobility training;Patient/family education;Iontophoresis 4mg /ml Dexamethasone;Moist Heat;Traction   ? PT Next Visit Plan Initiate HEP, treat acute symptoms of muscle tightness and pain.   ? ?  ?  ? ?  ? ? ?Patient will benefit from skilled therapeutic intervention in order to improve the following deficits and impairments:  Decreased range of motion, Increased fascial restricitons, Impaired sensation, Postural dysfunction, Improper body mechanics, Impaired flexibility, Decreased mobility, Decreased strength, Increased muscle spasms, Cardiopulmonary status limiting activity ? ?Visit Diagnosis: ?Chronic left shoulder pain ? ?Abnormal posture ? ?Muscle weakness (generalized) ? ?Radiculopathy, cervical region ? ? ? ? ?Problem List ?Patient Active Problem List  ? Diagnosis Date Noted  ? Body mass index between 19-24, adult 01/21/2014  ? Iron deficiency anemia 01/21/2014  ? Personal history of other genital system and obstetric disorders(V13.29) 09/29/2013  ? Noninflammatory disorder of vagina 08/07/2013  ? Irregular menstrual cycle 01/12/2013  ? Allergic rhinitis 01/08/2011  ? ? ?Scot Jun, PTA ?12/21/2021, 1:45 PM ? ?[facility] ?[address] ?[place], [place], [address] ?Phone: [phone]   Fax:  [phone] ? ?Name: Samantha Tapia ?MRN: [id number] ?Date of Birth: 02-03-1986 ? ? ? ?

## 2021-12-27 ENCOUNTER — Ambulatory Visit: Payer: 59 | Admitting: Physical Therapy

## 2021-12-27 ENCOUNTER — Encounter: Payer: Self-pay | Admitting: Physical Therapy

## 2021-12-27 DIAGNOSIS — M6281 Muscle weakness (generalized): Secondary | ICD-10-CM

## 2021-12-27 DIAGNOSIS — G8929 Other chronic pain: Secondary | ICD-10-CM

## 2021-12-27 DIAGNOSIS — M5412 Radiculopathy, cervical region: Secondary | ICD-10-CM

## 2021-12-27 DIAGNOSIS — M25512 Pain in left shoulder: Secondary | ICD-10-CM | POA: Diagnosis not present

## 2021-12-27 DIAGNOSIS — R293 Abnormal posture: Secondary | ICD-10-CM

## 2021-12-27 NOTE — Therapy (Signed)
[place] ?[address] ?[place], [place], [address] ?Phone: [phone]   Fax:  [phone] ? ?Physical Therapy Treatment ? ?Patient Details  ?Name: Samantha Tapia ?MRN: [id number] ?Date of Birth: 09-11-1986 ?Referring Provider (PT): Eldridge Abrahams ? ? ?Encounter Date: 12/27/2021 ? ? PT End of Session - 12/27/21 1804   ? ? Visit Number 3   ? Date for PT Re-Evaluation 02/07/22   ? PT Start Time [id number]   ? PT Stop Time [id number]   ? PT Time Calculation (min) 35 min   ? Activity Tolerance Patient tolerated treatment well   ? Behavior During Therapy [facility] for tasks assessed/performed   ? ?  ?  ? ?  ? ? ?Past Medical History:  ?Diagnosis Date  ? Anemia   ? Atypical chest pain   ? Fatigue   ? Heart murmur   ? Mitral regurgitation   ? Palpitations   ? ? ?History reviewed. No pertinent surgical history. ? ?There were no vitals filed for this visit. ? ? Subjective Assessment - 12/27/21 1723   ? ? Subjective Patient reports no issues after her last treatment. She has experienced some burning in her lateral shoulder the other day and had some tingling in her upper traps area on the L.   ? Diagnostic tests MRI 12/11/21-C3-4 small central disc protrusion with mild spinal stenosis, C4-5-small central disc protrusion with mild spinal stenosis and indents the ventral [place], mild L neural foraminal stenosis at C7-T1   ? Currently in Pain? No/denies   ? ?  ?  ? ?  ? ? ? ? ? ? ? ? ? ? ? ? ? ? ? ? ? ? ? ? [place] Adult PT Treatment/Exercise - 12/27/21 0001   ? ?  ? Neck Exercises: Stretches  ? Other Neck Stretches chin tucks   ?  ? Shoulder Exercises: Standing  ? External Rotation Strengthening;Both;10 reps;Theraband   ? Theraband Level (Shoulder External Rotation) Level 3 (Green)   ? Extension Strengthening;Both;10 reps   ? Theraband Level (Shoulder Extension) Level 3 (Green)   ? Row Strengthening;Both;10 reps   ? Theraband Level (Shoulder Row) Level 3 (Green)   ?  ? Shoulder Exercises: Stretch  ?  Other Shoulder Stretches Doorway stretch @ 90, 60, 120 degrees. 2 x 20 sec each   ?  ? Manual Therapy  ? Manual Therapy Soft tissue mobilization;Passive ROM   ? Soft tissue mobilization Deep tissue massage to L ant/mid deltoid, L up traps.   ? Passive ROM L up traps stretch, pect stretch, deltoid stretch. posterior cervical elongation   ? ?  ?  ? ?  ? ? ? ? ? ? ? ? ? ? PT Education - 12/27/21 1804   ? ? Education Details HEP   ? Person(s) Educated Patient   ? Methods Explanation;Demonstration;Handout   ? Comprehension Returned demonstration;Verbalized understanding   ? ?  ?  ? ?  ? ? ? PT Short Term Goals - 12/27/21 1834   ? ?  ? PT SHORT TERM GOAL #1  ? Title I with basic HEP   ? Time 4   ? Period Weeks   ? Status On-going   ? Target Date 01/10/22   ?  ? PT SHORT TERM GOAL #2  ? Title Patient will report delay in her symptom onset during sitting to > 45 minutes.   ? Baseline < 30 nminutes   ? Time 4   ? Period  Weeks   ? Status On-going   ? Target Date 01/10/22   ?  ? PT SHORT TERM GOAL #3  ? Title Patient will report delayed tingling in LUE with shoulder elevation > 90 degrees to > 5 minutes.   ? Baseline 1-2   ? Time 4   ? Period Weeks   ? Status On-going   ? Target Date 01/10/22   ? ?  ?  ? ?  ? ? ? ? PT Long Term Goals - 12/13/21 1430   ? ?  ? PT LONG TERM GOAL #1  ? Title I with final HEP   ? Time 8   ? Period Weeks   ? Status New   ? Target Date 02/07/22   ?  ? PT LONG TERM GOAL #2  ? Title Patient will be able to sit and work x 4 hours, with rest breaks of 5 minutes every 30 minutes for stretch and re-position, with pain < 4/10   ? Time 8   ? Period Weeks   ? Status New   ? Target Date 02/07/22   ?  ? PT LONG TERM GOAL #3  ? Title Patient will verbalize/demosntrate adjustments to her workspace to improve her trunk suppport, posture.   ? Time 8   ? Period Weeks   ? Status New   ? Target Date 02/07/22   ?  ? PT LONG TERM GOAL #4  ? Title Patient will be able to work with her shoulders elevated > 90 x at least  10 minutes without tingling or burning in her L ant shoulder.   ? Baseline 1-2 minutes   ? Time 8   ? Period Weeks   ? Status New   ? Target Date 02/07/22   ? ?  ?  ? ?  ? ? ? ? ? ? ? ? Plan - 12/27/21 1812   ? ? Clinical Impression Statement Patient arrived 10 minutes late. She reports no problems after last treatment. She had 1 episode of burning on lateral L delt and felt some burning/tingling in L up traps today. Treatment started with STM and stretch to upper trunk. Therapsit then initiated HEP with stretch and upper body strength/stabilization exercises. Paitent toerated all well.   ? Personal Factors and Comorbidities Past/Current Experience   ? Examination-Activity Limitations Reach Overhead;Hygiene/Grooming;Lift;Carry;Caring for Others;Bathing;Dressing   ? Examination-Participation Restrictions Occupation   ? Stability/Clinical Decision Making Stable/Uncomplicated   ? Clinical Decision Making Low   ? Rehab Potential Good   ? PT Frequency 2x / week   ? PT Duration 8 weeks   ? PT Treatment/Interventions ADLs/Self Care Home Management;Electrical Stimulation;Cryotherapy;Neuromuscular re-education;Manual techniques;Dry needling;Therapeutic exercise;Passive range of motion;Therapeutic activities;Functional mobility training;Patient/family education;Iontophoresis 4mg /ml Dexamethasone;Moist Heat;Traction   ? PT Next Visit Plan Initiate HEP, treat acute symptoms of muscle tightness and pain.   ? PT Home Exercise Plan [id number]   ? Consulted and Agree with Plan of Care Patient   ? ?  ?  ? ?  ? ? ?Patient will benefit from skilled therapeutic intervention in order to improve the following deficits and impairments:  Decreased range of motion, Increased fascial restricitons, Impaired sensation, Postural dysfunction, Improper body mechanics, Impaired flexibility, Decreased mobility, Decreased strength, Increased muscle spasms, Cardiopulmonary status limiting activity ? ?Visit Diagnosis: ?Chronic left shoulder  pain ? ?Abnormal posture ? ?Muscle weakness (generalized) ? ?Radiculopathy, cervical region ? ? ? ? ?Problem List ?Patient Active Problem List  ? Diagnosis Date Noted  ? Body  mass index between 19-24, adult 01/21/2014  ? Iron deficiency anemia 01/21/2014  ? Personal history of other genital system and obstetric disorders(V13.29) 09/29/2013  ? Noninflammatory disorder of vagina 08/07/2013  ? Irregular menstrual cycle 01/12/2013  ? Allergic rhinitis 01/08/2011  ? ? ?Marcelina Morel, DPT ?12/27/2021, 6:37 PM ? ?[facility] ?[place] [place] ?[address] ?[place], [place], [address] ?Phone: [phone]   Fax:  [phone] ? ?Name: Nary Dembek ?MRN: [id number] ?Date of Birth: [birth year] ? ? ? ?

## 2021-12-27 NOTE — Patient Instructions (Signed)
Access Code: [email or web address] ?URL: [email or web address] ?Date: 12/27/2021 ?Prepared by: Ethel Rana ? ?Exercises ?- Doorway Pec Stretch at 90 Degrees Abduction  - 1 x daily - 7 x weekly - 2 reps - 15 hold ?- Doorway Pec Stretch at 60 Elevation  - 1 x daily - 7 x weekly - 2 reps - 15 hold ?- Doorway Pec Stretch at 120 Degrees Abduction  - 1 x daily - 7 x weekly - 2 reps - 15 hold ?- Shoulder extension with resistance - Neutral  - 1 x daily - 7 x weekly - 2 sets - 10 reps ?- Standing Bilateral Low Shoulder Row with Anchored Resistance  - 1 x daily - 7 x weekly - 2 sets - 10 reps ?- Shoulder External Rotation and Scapular Retraction with Resistance  - 1 x daily - 7 x weekly - 2 sets - 10 reps ?- Seated Scapular Retraction  - 1 x daily - 7 x weekly - 2 sets - 10 reps ?- Seated Cervical Retraction  - 1 x daily - 7 x weekly - 5 reps - 5 hold ?- Supine Chin Tuck  - 1 x daily - 7 x weekly - 5 reps - 5 hold ?

## 2021-12-29 ENCOUNTER — Ambulatory Visit: Payer: 59 | Admitting: Physical Therapy

## 2021-12-29 ENCOUNTER — Encounter: Payer: Self-pay | Admitting: Physical Therapy

## 2021-12-29 DIAGNOSIS — M5412 Radiculopathy, cervical region: Secondary | ICD-10-CM

## 2021-12-29 DIAGNOSIS — M6281 Muscle weakness (generalized): Secondary | ICD-10-CM

## 2021-12-29 DIAGNOSIS — G8929 Other chronic pain: Secondary | ICD-10-CM

## 2021-12-29 DIAGNOSIS — R293 Abnormal posture: Secondary | ICD-10-CM

## 2021-12-29 DIAGNOSIS — M25512 Pain in left shoulder: Secondary | ICD-10-CM | POA: Diagnosis not present
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
## 2022-01-01 ENCOUNTER — Encounter: Payer: Self-pay | Admitting: Physical Therapy

## 2022-01-01 ENCOUNTER — Ambulatory Visit: Payer: 59 | Attending: Nurse Practitioner | Admitting: Physical Therapy

## 2022-01-01 DIAGNOSIS — M5412 Radiculopathy, cervical region: Secondary | ICD-10-CM | POA: Insufficient documentation

## 2022-01-01 DIAGNOSIS — M6281 Muscle weakness (generalized): Secondary | ICD-10-CM | POA: Insufficient documentation

## 2022-01-01 DIAGNOSIS — M25512 Pain in left shoulder: Secondary | ICD-10-CM | POA: Diagnosis present

## 2022-01-01 DIAGNOSIS — R293 Abnormal posture: Secondary | ICD-10-CM | POA: Diagnosis present

## 2022-01-01 DIAGNOSIS — G8929 Other chronic pain: Secondary | ICD-10-CM | POA: Insufficient documentation

## 2022-01-01 NOTE — Therapy (Signed)
[place] [facility] ?[place] ?[address] ?[place], [place], [address] ?Phone: [phone]   Fax:  [phone] ? ?Physical Therapy Treatment ? ?Patient Details  ?Name: Samantha Tapia ?MRN: [id number] ?Date of Birth: Mar 23, 1986 ?Referring Provider (PT): Eldridge Abrahams ? ? ?Encounter Date: 01/01/2022 ? ? PT End of Session - 01/01/22 1359   ? ? Visit Number 5   ? Date for PT Re-Evaluation 02/07/22   ? PT Start Time 1326   arrived late  ? PT Stop Time 1356   ? PT Time Calculation (min) 30 min   ? Activity Tolerance Patient tolerated treatment well   ? Behavior During Therapy [facility] for tasks assessed/performed   ? ?  ?  ? ?  ? ? ?Past Medical History:  ?Diagnosis Date  ? Anemia   ? Atypical chest pain   ? Fatigue   ? Heart murmur   ? Mitral regurgitation   ? Palpitations   ? ? ?History reviewed. No pertinent surgical history. ? ?There were no vitals filed for this visit. ? ? Subjective Assessment - 01/01/22 1327   ? ? Subjective I'm feeling good today, not having any pain. I feel like the deep tissue work really helped last time.   ? Patient Stated Goals Patietn would like to get more lasting relief from the tension and tenderness.   ? Currently in Pain? No/denies   ? ?  ?  ? ?  ? ? ? ? ? ? ? ? ? ? ? ? ? ? ? ? ? ? ? ? [place] Adult PT Treatment/Exercise - 01/01/22 0001   ? ?  ? Shoulder Exercises: Power Tower  ? Extension 15 reps   ? Extension Limitations 15#, 1x15 reps   ? Row 15 reps   ? Row Limitations 15# 1x15   ? Other Power Engineer, water Lat pull downs 20#, 1x15 reps   ? Other [employer] Consult[employer] civil engineer curl with OH press 1x5 B 5#   ?  ? Manual Therapy  ? Manual Therapy Soft tissue mobilization   ? Soft tissue mobilization STM to cervical muscles   ? ?  ?  ? ?  ? ?UBE L2 x3 min forward/3 min backward  ? ? ? ? ? ? ? ? PT Education - 01/01/22 1359   ? ? Education Details exericse form/purpose   ? Person(s) Educated Patient   ? Methods Explanation   ? Comprehension Verbalized  understanding   ? ?  ?  ? ?  ? ? ? PT Short Term Goals - 12/27/21 1834   ? ?  ? PT SHORT TERM GOAL #1  ? Title I with basic HEP   ? Time 4   ? Period Weeks   ? Status On-going   ? Target Date 01/10/22   ?  ? PT SHORT TERM GOAL #2  ? Title Patient will report delay in her symptom onset during sitting to > 45 minutes.   ? Baseline < 30 nminutes   ? Time 4   ? Period Weeks   ? Status On-going   ? Target Date 01/10/22   ?  ? PT SHORT TERM GOAL #3  ? Title Patient will report delayed tingling in LUE with shoulder elevation > 90 degrees to > 5 minutes.   ? Baseline 1-2   ? Time 4   ? Period Weeks   ? Status On-going   ? Target Date 01/10/22   ? ?  ?  ? ?  ? ? ? ?  PT Long Term Goals - 12/13/21 1430   ? ?  ? PT LONG TERM GOAL #1  ? Title I with final HEP   ? Time 8   ? Period Weeks   ? Status New   ? Target Date 02/07/22   ?  ? PT LONG TERM GOAL #2  ? Title Patient will be able to sit and work x 4 hours, with rest breaks of 5 minutes every 30 minutes for stretch and re-position, with pain < 4/10   ? Time 8   ? Period Weeks   ? Status New   ? Target Date 02/07/22   ?  ? PT LONG TERM GOAL #3  ? Title Patient will verbalize/demosntrate adjustments to her workspace to improve her trunk suppport, posture.   ? Time 8   ? Period Weeks   ? Status New   ? Target Date 02/07/22   ?  ? PT LONG TERM GOAL #4  ? Title Patient will be able to work with her shoulders elevated > 90 x at least 10 minutes without tingling or burning in her L ant shoulder.   ? Baseline 1-2 minutes   ? Time 8   ? Period Weeks   ? Status New   ? Target Date 02/07/22   ? ?  ?  ? ?  ? ? ? ? ? ? ? ? Plan - 01/01/22 1359   ? ? Clinical Impression Statement Leolia arrives a little late today doing OK, her pain is doing much better. We continued working on strength training today, she tells me her arm gets tired really quickly when doing overhead tasks. We spent some time cautiously working on overhead tasks today as well, finished session with ongoing STM to tight  mm groups. Will continue efforts.   ? Personal Factors and Comorbidities Past/Current Experience   ? Examination-Activity Limitations Reach Overhead;Hygiene/Grooming;Lift;Carry;Caring for Others;Bathing;Dressing   ? Examination-Participation Restrictions Occupation   ? Stability/Clinical Decision Making Stable/Uncomplicated   ? Clinical Decision Making Low   ? Rehab Potential Good   ? PT Frequency 2x / week   ? PT Duration 8 weeks   ? PT Treatment/Interventions ADLs/Self Care Home Management;Electrical Stimulation;Cryotherapy;Neuromuscular re-education;Manual techniques;Dry needling;Therapeutic exercise;Passive range of motion;Therapeutic activities;Functional mobility training;Patient/family education;Iontophoresis 4mg /ml Dexamethasone;Moist Heat;Traction   ? PT Next Visit Plan Cont current POC, progressing as tolerated. STM To paraspinals and L deltoid   ? PT Home Exercise Plan [id number]   ? Consulted and Agree with Plan of Care Patient   ? ?  ?  ? ?  ? ? ?Patient will benefit from skilled therapeutic intervention in order to improve the following deficits and impairments:  Decreased range of motion, Increased fascial restricitons, Impaired sensation, Postural dysfunction, Improper body mechanics, Impaired flexibility, Decreased mobility, Decreased strength, Increased muscle spasms, Cardiopulmonary status limiting activity ? ?Visit Diagnosis: ?Chronic left shoulder pain ? ?Muscle weakness (generalized) ? ?Abnormal posture ? ?Radiculopathy, cervical region ? ? ? ? ?Problem List ?Patient Active Problem List  ? Diagnosis Date Noted  ? Body mass index between 19-24, adult 01/21/2014  ? Iron deficiency anemia 01/21/2014  ? Personal history of other genital system and obstetric disorders(V13.29) 09/29/2013  ? Noninflammatory disorder of vagina 08/07/2013  ? Irregular menstrual cycle 01/12/2013  ? Allergic rhinitis 01/08/2011  ? ?Elbie Statzer U PT, DPT, PN2  ? ?Supplemental Physical Therapist ?[place]  ? ? ? ? ? ?[facility] ?[address] ?[place], [place], [address] ?  Phone: [phone]   Fax:  [phone] ? ?Name: Marlisa Cavagnaro ?MRN: [id number] ?Date of Birth: 1985-10-20 ? ? ? ?

## 2022-01-03 ENCOUNTER — Ambulatory Visit: Payer: 59 | Admitting: Physical Therapy

## 2022-01-03 ENCOUNTER — Encounter: Payer: Self-pay | Admitting: Physical Therapy

## 2022-01-03 DIAGNOSIS — M6281 Muscle weakness (generalized): Secondary | ICD-10-CM

## 2022-01-03 DIAGNOSIS — M25512 Pain in left shoulder: Secondary | ICD-10-CM | POA: Diagnosis not present

## 2022-01-03 DIAGNOSIS — G8929 Other chronic pain: Secondary | ICD-10-CM

## 2022-01-03 DIAGNOSIS — M5412 Radiculopathy, cervical region: Secondary | ICD-10-CM

## 2022-01-03 DIAGNOSIS — R293 Abnormal posture: Secondary | ICD-10-CM

## 2022-01-03 NOTE — Patient Instructions (Signed)

## 2022-01-03 NOTE — Therapy (Signed)
[place] ?[place] [address] ?[place], [place], [address] ?Phone: [phone]   Fax:  [phone] ? ?Physical Therapy Treatment ? ?Patient Details  ?Name: Samantha Tapia ?MRN: [id number] ?Date of Birth: December 28, 1985 ?Referring Provider (PT): Eldridge Abrahams ? ? ?Encounter Date: 01/03/2022 ? ? PT End of Session - 01/03/22 1830   ? ? Visit Number 6   ? Date for PT Re-Evaluation 02/07/22   ? PT Start Time 1742   ? PT Stop Time 7564   ? PT Time Calculation (min) 48 min   ? Activity Tolerance Patient tolerated treatment well   ? Behavior During Therapy [facility] for tasks assessed/performed   ? ?  ?  ? ?  ? ? ?Past Medical History:  ?Diagnosis Date  ? Anemia   ? Atypical chest pain   ? Fatigue   ? Heart murmur   ? Mitral regurgitation   ? Palpitations   ? ? ?History reviewed. No pertinent surgical history. ? ?There were no vitals filed for this visit. ? ? Subjective Assessment - 01/03/22 1747   ? ? Subjective I am tight today, I think what we are doing is helping, some tingling in the left upper trap last night   ? Currently in Pain? Yes   ? Pain Score 3    ? Pain Location Neck   ? Pain Orientation Left   ? Pain Descriptors / Indicators Tingling;Spasm;Tightness   ? Aggravating Factors  stress, sitting at work   ? Pain Relieving Factors the massage helps alot   ? ?  ?  ? ?  ? ? ? ? ? ? ? ? ? ? ? ? ? ? ? ? ? ? ? ? [place] Adult PT Treatment/Exercise - 01/03/22 0001   ? ?  ? Neck Exercises: Machines for Strengthening  ? UBE (Upper Arm Bike) level 2 x 6 minutes   ?  ? Neck Exercises: Standing  ? Other Standing Exercises 5# shrugs with upper trap and levator stretches   ?  ? Neck Exercises: Stretches  ? Corner Stretch 3 reps;10 seconds   ?  ? Shoulder Exercises: Power Tower  ? Row 20 reps   ? Row Limitations 15#   ? Other Power Engineer, water Lat pull downs 20#, 2x15 reps   ?  ? Manual Therapy  ? Soft tissue mobilization left upper trap, cervical and rhomboids   ? ?  ?  ? ?  ? ? ? [place] Dry Needling - 01/03/22 0001   ? ? Consent Given? Yes   ? Education Handout Provided Yes   ? Muscles Treated Head and Neck Upper trapezius   ? Upper Trapezius Response Twitch reponse elicited;Palpable increased muscle length   ? ?  ?  ? ?  ? ? ? ? ? ? ? ? ? ? PT Short Term Goals - 01/03/22 1832   ? ?  ? PT SHORT TERM GOAL #1  ? Title I with basic HEP   ? Status Partially Met   ?  ? PT SHORT TERM GOAL #2  ? Title Patient will report delay in her symptom onset during sitting to > 45 minutes.   ? Status Partially Met   ? ?  ?  ? ?  ? ? ? ? PT Long Term Goals - 12/13/21 1430   ? ?  ? PT LONG TERM GOAL #1  ? Title I with final HEP   ? Time 8   ?  Period Weeks   ? Status New   ? Target Date 02/07/22   ?  ? PT LONG TERM GOAL #2  ? Title Patient will be able to sit and work x 4 hours, with rest breaks of 5 minutes every 30 minutes for stretch and re-position, with pain < 4/10   ? Time 8   ? Period Weeks   ? Status New   ? Target Date 02/07/22   ?  ? PT LONG TERM GOAL #3  ? Title Patient will verbalize/demosntrate adjustments to her workspace to improve her trunk suppport, posture.   ? Time 8   ? Period Weeks   ? Status New   ? Target Date 02/07/22   ?  ? PT LONG TERM GOAL #4  ? Title Patient will be able to work with her shoulders elevated > 90 x at least 10 minutes without tingling or burning in her L ant shoulder.   ? Baseline 1-2 minutes   ? Time 8   ? Period Weeks   ? Status New   ? Target Date 02/07/22   ? ?  ?  ? ?  ? ? ? ? ? ? ? ? Plan - 01/03/22 1830   ? ? Clinical Impression Statement I added dry needling today, she had significant LTR's in the left upper trap, Added upper trap and levator stretches She is very tight with a lot of knots in the upper trap and into the cervical area,  I did some scapular mobilization in prone with hand on back   ? PT Next Visit Plan see how the DN did and would traction help   ? Consulted and Agree with Plan of Care Patient   ? ?  ?  ? ?  ? ? ?Patient will benefit from skilled  therapeutic intervention in order to improve the following deficits and impairments:  Decreased range of motion, Increased fascial restricitons, Impaired sensation, Postural dysfunction, Improper body mechanics, Impaired flexibility, Decreased mobility, Decreased strength, Increased muscle spasms, Cardiopulmonary status limiting activity ? ?Visit Diagnosis: ?Chronic left shoulder pain ? ?Muscle weakness (generalized) ? ?Abnormal posture ? ?Radiculopathy, cervical region ? ? ? ? ?Problem List ?Patient Active Problem List  ? Diagnosis Date Noted  ? Body mass index between 19-24, adult 01/21/2014  ? Iron deficiency anemia 01/21/2014  ? Personal history of other genital system and obstetric disorders(V13.29) 09/29/2013  ? Noninflammatory disorder of vagina 08/07/2013  ? Irregular menstrual cycle 01/12/2013  ? Allergic rhinitis 01/08/2011  ? ? Sumner Boast, PT ?01/03/2022, 6:33 PM ? ?[place] ?[place] ?[place], [place], [address] ?Phone: [phone]   Fax:  [phone] ? ?Name: Zyana Amaro ?MRN: [id number] ?Date of Birth: May 05, 1986 ? ? ? ?

## 2022-01-08 ENCOUNTER — Ambulatory Visit: Payer: 59 | Admitting: Physical Therapy

## 2022-01-10 ENCOUNTER — Ambulatory Visit: Payer: 59 | Admitting: Physical Therapy

## 2022-01-10 ENCOUNTER — Encounter: Payer: Self-pay | Admitting: Physical Therapy

## 2022-01-10 DIAGNOSIS — G8929 Other chronic pain: Secondary | ICD-10-CM

## 2022-01-10 DIAGNOSIS — M5412 Radiculopathy, cervical region: Secondary | ICD-10-CM

## 2022-01-10 DIAGNOSIS — R293 Abnormal posture: Secondary | ICD-10-CM

## 2022-01-10 DIAGNOSIS — M6281 Muscle weakness (generalized): Secondary | ICD-10-CM

## 2022-01-10 DIAGNOSIS — M25512 Pain in left shoulder: Secondary | ICD-10-CM | POA: Diagnosis not present
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
## 2022-01-15 ENCOUNTER — Ambulatory Visit: Payer: 59 | Admitting: Physical Therapy

## 2022-01-17 ENCOUNTER — Ambulatory Visit: Payer: 59 | Admitting: Physical Therapy

## 2022-01-17 ENCOUNTER — Encounter: Payer: Self-pay | Admitting: Physical Therapy

## 2022-01-17 DIAGNOSIS — M6281 Muscle weakness (generalized): Secondary | ICD-10-CM

## 2022-01-17 DIAGNOSIS — R293 Abnormal posture: Secondary | ICD-10-CM

## 2022-01-17 DIAGNOSIS — G8929 Other chronic pain: Secondary | ICD-10-CM

## 2022-01-17 DIAGNOSIS — M5412 Radiculopathy, cervical region: Secondary | ICD-10-CM

## 2022-01-17 DIAGNOSIS — M25512 Pain in left shoulder: Secondary | ICD-10-CM | POA: Diagnosis not present
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
## 2022-01-25 ENCOUNTER — Encounter: Payer: Self-pay | Admitting: Physical Therapy

## 2022-01-25 ENCOUNTER — Ambulatory Visit: Payer: 59 | Admitting: Physical Therapy

## 2022-01-25 DIAGNOSIS — R293 Abnormal posture: Secondary | ICD-10-CM

## 2022-01-25 DIAGNOSIS — M5412 Radiculopathy, cervical region: Secondary | ICD-10-CM

## 2022-01-25 DIAGNOSIS — M25512 Pain in left shoulder: Secondary | ICD-10-CM | POA: Diagnosis not present

## 2022-01-25 DIAGNOSIS — M6281 Muscle weakness (generalized): Secondary | ICD-10-CM

## 2022-01-25 DIAGNOSIS — G8929 Other chronic pain: Secondary | ICD-10-CM

## 2022-01-25 NOTE — Patient Instructions (Signed)
Access Code: [email or web address] ?URL: [email or web address] ?Date: 01/25/2022 ?Prepared by: Ethel Rana ? ?Exercises ?- Doorway Pec Stretch at 90 Degrees Abduction  - 1 x daily - 7 x weekly - 2 reps - 15 hold ?- Doorway Pec Stretch at 60 Elevation  - 1 x daily - 7 x weekly - 2 reps - 15 hold ?- Doorway Pec Stretch at 120 Degrees Abduction  - 1 x daily - 7 x weekly - 2 reps - 15 hold ?- Shoulder extension with resistance - Neutral  - 1 x daily - 7 x weekly - 2 sets - 10 reps ?- Standing Bilateral Low Shoulder Row with Anchored Resistance  - 1 x daily - 7 x weekly - 2 sets - 10 reps ?- Shoulder External Rotation and Scapular Retraction with Resistance  - 1 x daily - 7 x weekly - 2 sets - 10 reps ?- Seated Scapular Retraction  - 1 x daily - 7 x weekly - 2 sets - 10 reps ?- Seated Cervical Retraction  - 1 x daily - 7 x weekly - 5 reps - 5 hold ?- Supine Chin Tuck  - 1 x daily - 7 x weekly - 5 reps - 5 hold ?- Seated Scalenes Stretch  - 1 x daily - 7 x weekly - 3 reps - 15 hold ?

## 2022-01-25 NOTE — Therapy (Signed)
[place] [address] ?[place], [place], [address] ?Phone: [phone]   Fax:  [phone] ? ?Physical Therapy Treatment ? ?Patient Details  ?Name: Samantha Tapia ?MRN: [id number] ?Date of Birth: 07-15-1986 ?Referring Provider (PT): Eldridge Abrahams ? ? ?Encounter Date: 01/25/2022 ? ? PT End of Session - 01/25/22 0834   ? ? Visit Number 9   ? Date for PT Re-Evaluation 02/07/22   ? PT Start Time 0802   ? PT Stop Time [phone]   ? PT Time Calculation (min) 40 min   ? Activity Tolerance Patient tolerated treatment well   ? Behavior During Therapy [facility] for tasks assessed/performed   ? ?  ?  ? ?  ? ? ?Past Medical History:  ?Diagnosis Date  ? Anemia   ? Atypical chest pain   ? Fatigue   ? Heart murmur   ? Mitral regurgitation   ? Palpitations   ? ? ?History reviewed. No pertinent surgical history. ? ?There were no vitals filed for this visit. ? ? Subjective Assessment - 01/25/22 0818   ? ? Subjective Patient reports that her pain is improved, but she is still having the burning in her shoulder.   ? Currently in Pain? No/denies   ? ?  ?  ? ?  ? ? ? ? ? ? ? ? ? ? ? ? ? ? ? ? ? ? ? ? [place] Adult PT Treatment/Exercise - 01/25/22 0001   ? ?  ? Neck Exercises: Machines for Strengthening  ? UBE (Upper Arm Bike) L1.5, 3 min for/back   ?  ? Shoulder Exercises: Seated  ? Extension Strengthening;Both;20 reps   ? Extension Weight (lbs) 2   ? Row Strengthening;Both;20 reps   ? Row Weight (lbs) 2#, bent over.   ?  ? Shoulder Exercises: Standing  ? Other Standing Exercises 3 way shoulder elevatoin, flex, abd, ext, 10 reps each with 2# weights.   ?  ? Shoulder Exercises: Stretch  ? Other Shoulder Stretches Pendulum with 2# for gentle distraction of humerus. 60 sec, L   ?  ? Manual Therapy  ? Manual Therapy Soft tissue mobilization;Manual Traction   ? Soft tissue mobilization B up traps, L scalenes and pects.   ? Manual Traction cervical x 60 seconds   ? ?  ?  ? ?  ? ? ? ? ? ? ? ? ? ? PT  Education - 01/25/22 0846   ? ? Education Details HEP stretches, neural tension.   ? Person(s) Educated Patient   ? Methods Explanation;Handout;Demonstration   ? Comprehension Verbalized understanding;Returned demonstration   ? ?  ?  ? ?  ? ? ? PT Short Term Goals - 01/25/22 0835   ? ?  ? PT SHORT TERM GOAL #1  ? Title I with basic HEP   ? Status Achieved   ?  ? PT SHORT TERM GOAL #2  ? Title Patient will report delay in her symptom onset during sitting to > 45 minutes.   ? Baseline Patient reports improved tolerance at her workstation, but still fluctuating.   ? Time 2   ? Period Weeks   ? Status Partially Met   ?  ? PT SHORT TERM GOAL #3  ? Title Patient will report delayed tingling in LUE with shoulder elevation > 90 degrees to > 5 minutes.   ? Time 2   ? Period Weeks   ? Status On-going   ? ?  ?  ? ?  ? ? ? ?  PT Long Term Goals - 01/25/22 0836   ? ?  ? PT LONG TERM GOAL #1  ? Title I with final HEP   ? Time 4   ? Period Weeks   ? Status On-going   ? Target Date 02/07/22   ?  ? PT LONG TERM GOAL #2  ? Title Patient will be able to sit and work x 4 hours, with rest breaks of 5 minutes every 30 minutes for stretch and re-position, with pain < 4/10   ? Baseline Inconsistant relief.   ? Time 4   ? Period Weeks   ? Status On-going   ? Target Date 02/07/22   ?  ? PT LONG TERM GOAL #3  ? Title Patient will verbalize/demosntrate adjustments to her workspace to improve her trunk suppport, posture.   ? Time 8   ? Period Weeks   ? Status Achieved   ? Target Date 02/07/22   ?  ? PT LONG TERM GOAL #4  ? Title Patient will be able to work with her shoulders elevated > 90 x at least 10 minutes without tingling or burning in her L ant shoulder.   ? Baseline minutes   ? Time 4   ? Period Weeks   ? Status New   ? Target Date 02/07/22   ? ?  ?  ? ?  ? ? ? ? ? ? ? ? Plan - 01/25/22 0846   ? ? Clinical Impression Statement Patient reports relief of tingling after injection, but continues to feel burning deep in her lateral L ant Delt  at times, random in nature. Performed STM to Up traps and anterior muscles on L F/B stretch and then strengthening for upper body. She demosntrated good form with exercises.   ? Personal Factors and Comorbidities Past/Current Experience   ? Examination-Activity Limitations Reach Overhead;Hygiene/Grooming;Lift;Carry;Caring for Others;Bathing;Dressing   ? Examination-Participation Restrictions Occupation   ? Stability/Clinical Decision Making Stable/Uncomplicated   ? Clinical Decision Making Low   ? Rehab Potential Good   ? PT Frequency 2x / week   ? PT Duration 2 weeks   ? PT Treatment/Interventions ADLs/Self Care Home Management;Electrical Stimulation;Cryotherapy;Neuromuscular re-education;Manual techniques;Dry needling;Therapeutic exercise;Passive range of motion;Therapeutic activities;Functional mobility training;Patient/family education;Iontophoresis 17m/ml Dexamethasone;Moist Heat;Traction   ? PT Next Visit Plan [place]? Additional upper body stregnth and stretch.   ? PT Home Exercise Plan [id number]  ? Consulted and Agree with Plan of Care Patient   ? ?  ?  ? ?  ? ? ?Patient will benefit from skilled therapeutic intervention in order to improve the following deficits and impairments:  Decreased range of motion, Increased fascial restricitons, Impaired sensation, Postural dysfunction, Improper body mechanics, Impaired flexibility, Decreased mobility, Decreased strength, Increased muscle spasms, Cardiopulmonary status limiting activity ? ?Visit Diagnosis: ?Chronic left shoulder pain ? ?Muscle weakness (generalized) ? ?Abnormal posture ? ?Radiculopathy, cervical region ? ? ? ? ?Problem List ?Patient Active Problem List  ? Diagnosis Date Noted  ? Body mass index between 19-24, adult 01/21/2014  ? Iron deficiency anemia 01/21/2014  ? Personal history of other genital system and obstetric disorders(V13.29) 09/29/2013  ? Noninflammatory disorder of vagina 08/07/2013  ? Irregular menstrual cycle 01/12/2013  ? Allergic  rhinitis 01/08/2011  ? ? ?SMarcelina Morel DPT ?01/25/2022, 8:51 AM ? ?[place] [address] ?[place] [address]?Phone: [phone]  Fax:  [phone]? ?Name: Samantha Tapia?MRN: [id number]?Date of Birth: 114-Jun-1987? ? ? ?

## 2022-01-29 ENCOUNTER — Ambulatory Visit: Payer: 59 | Attending: Nurse Practitioner | Admitting: Physical Therapy

## 2022-01-29 ENCOUNTER — Encounter: Payer: Self-pay | Admitting: Physical Therapy

## 2022-01-29 DIAGNOSIS — M5412 Radiculopathy, cervical region: Secondary | ICD-10-CM | POA: Insufficient documentation

## 2022-01-29 DIAGNOSIS — G8929 Other chronic pain: Secondary | ICD-10-CM | POA: Diagnosis present

## 2022-01-29 DIAGNOSIS — M25512 Pain in left shoulder: Secondary | ICD-10-CM | POA: Diagnosis present

## 2022-01-29 DIAGNOSIS — R293 Abnormal posture: Secondary | ICD-10-CM | POA: Insufficient documentation

## 2022-01-29 DIAGNOSIS — M6281 Muscle weakness (generalized): Secondary | ICD-10-CM | POA: Diagnosis present

## 2022-01-29 NOTE — Therapy (Signed)
[place] ?[place] ?[place], [place], [address] ?Phone: [phone]   Fax:  [phone] ? ?Physical Therapy Treatment ? ?Patient Details  ?Name: Samantha Tapia ?MRN: [id number] ?Date of Birth: 1985/11/26 ?Referring Provider (PT): Eldridge Abrahams ? ? ?Encounter Date: 01/29/2022 ? ? PT End of Session - 01/29/22 1836   ? ? Visit Number 10   ? Date for PT Re-Evaluation 02/07/22   ? Authorization Type UHC   ? PT Start Time 6962   ? PT Stop Time 9528   ? PT Time Calculation (min) 49 min   ? Activity Tolerance Patient tolerated treatment well   ? Behavior During Therapy [facility] for tasks assessed/performed   ? ?  ?  ? ?  ? ? ?Past Medical History:  ?Diagnosis Date  ? Anemia   ? Atypical chest pain   ? Fatigue   ? Heart murmur   ? Mitral regurgitation   ? Palpitations   ? ? ?History reviewed. No pertinent surgical history. ? ?There were no vitals filed for this visit. ? ? Subjective Assessment - 01/29/22 1750   ? ? Subjective Patient reports that she is feeling better overall   ? Currently in Pain? No/denies   ? ?  ?  ? ?  ? ? ? ? ? ? ? ? ? ? ? ? ? ? ? ? ? ? ? ? [place] Adult PT Treatment/Exercise - 01/29/22 0001   ? ?  ? Neck Exercises: Machines for Strengthening  ? UBE (Upper Arm Bike) L3, 3 min for/back   ? Cybex Row 20# 2x10   ? Cybex Chest Press 10# 2x10   ? Lat Pull 20# 2x10   ?  ? Manual Therapy  ? Manual Therapy Soft tissue mobilization;Manual Traction   ? Soft tissue mobilization B up traps, L scalenes and pects.   ? Manual Traction cervical x 60 seconds   ? ?  ?  ? ?  ? ? ? Trigger Point Dry Needling - 01/29/22 0001   ? ? Consent Given? Yes   ? Muscles Treated Upper Quadrant Pectoralis major;Deltoid   ? Upper Trapezius Response Twitch reponse elicited;Palpable increased muscle length   ? Pectoralis Major Response Palpable increased muscle length   ? Deltoid Response Twitch response elicited;Palpable increased muscle length   ? ?  ?  ? ?  ? ? ? ? ? ? ? ? ? ? PT Short  Term Goals - 01/25/22 0835   ? ?  ? PT SHORT TERM GOAL #1  ? Title I with basic HEP   ? Status Achieved   ?  ? PT SHORT TERM GOAL #2  ? Title Patient will report delay in her symptom onset during sitting to > 45 minutes.   ? Baseline Patient reports improved tolerance at her workstation, but still fluctuating.   ? Time 2   ? Period Weeks   ? Status Partially Met   ?  ? PT SHORT TERM GOAL #3  ? Title Patient will report delayed tingling in LUE with shoulder elevation > 90 degrees to > 5 minutes.   ? Time 2   ? Period Weeks   ? Status On-going   ? ?  ?  ? ?  ? ? ? ? PT Long Term Goals - 01/29/22 1837   ? ?  ? PT LONG TERM GOAL #2  ? Title Patient will be able to sit and work x 4 hours, with rest breaks  of 5 minutes every 30 minutes for stretch and re-position, with pain < 4/10   ? Status On-going   ?  ? PT LONG TERM GOAL #3  ? Title Patient will verbalize/demosntrate adjustments to her workspace to improve her trunk suppport, posture.   ? Status Achieved   ?  ? PT LONG TERM GOAL #4  ? Title Patient will be able to work with her shoulders elevated > 90 x at least 10 minutes without tingling or burning in her L ant shoulder.   ? Status Partially Met   ? ?  ?  ? ?  ? ? ? ? ? ? ? ? Plan - 01/29/22 1836   ? ? Clinical Impression Statement Patient continues to report tightness and pain in the left upper arm and the left pectoral area, I added dry needling to these areas today, tried to add some strength, she did well with some cues for form and posture   ? PT Next Visit Plan see how she does after today's session   ? Consulted and Agree with Plan of Care Patient   ? ?  ?  ? ?  ? ? ?Patient will benefit from skilled therapeutic intervention in order to improve the following deficits and impairments:  Decreased range of motion, Increased fascial restricitons, Impaired sensation, Postural dysfunction, Improper body mechanics, Impaired flexibility, Decreased mobility, Decreased strength, Increased muscle spasms, Cardiopulmonary  status limiting activity ? ?Visit Diagnosis: ?Chronic left shoulder pain ? ?Abnormal posture ? ?Muscle weakness (generalized) ? ?Radiculopathy, cervical region ? ? ? ? ?Problem List ?Patient Active Problem List  ? Diagnosis Date Noted  ? Body mass index between 19-24, adult 01/21/2014  ? Iron deficiency anemia 01/21/2014  ? Personal history of other genital system and obstetric disorders(V13.29) 09/29/2013  ? Noninflammatory disorder of vagina 08/07/2013  ? Irregular menstrual cycle 01/12/2013  ? Allergic rhinitis 01/08/2011  ? ? Sumner Boast, PT ?01/29/2022, 6:38 PM ? ?[place] ?[address] ?[place], [place], [address] ?Phone: [phone]   Fax:  [phone] ? ?Name: Samantha Tapia ?MRN: [id number] ?Date of Birth: 12-26-1985 ? ? ? ?

## 2022-02-07 ENCOUNTER — Encounter: Payer: Self-pay | Admitting: Physical Therapy

## 2022-02-07 ENCOUNTER — Ambulatory Visit: Payer: 59 | Admitting: Physical Therapy

## 2022-02-07 DIAGNOSIS — G8929 Other chronic pain: Secondary | ICD-10-CM

## 2022-02-07 DIAGNOSIS — M5412 Radiculopathy, cervical region: Secondary | ICD-10-CM

## 2022-02-07 DIAGNOSIS — M6281 Muscle weakness (generalized): Secondary | ICD-10-CM

## 2022-02-07 DIAGNOSIS — M25512 Pain in left shoulder: Secondary | ICD-10-CM | POA: Diagnosis not present

## 2022-02-07 DIAGNOSIS — R293 Abnormal posture: Secondary | ICD-10-CM
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
## 2022-02-14 ENCOUNTER — Encounter: Payer: Self-pay | Admitting: Physical Therapy

## 2022-02-14 ENCOUNTER — Ambulatory Visit: Payer: 59 | Admitting: Physical Therapy

## 2022-02-14 DIAGNOSIS — R293 Abnormal posture: Secondary | ICD-10-CM

## 2022-02-14 DIAGNOSIS — M25512 Pain in left shoulder: Secondary | ICD-10-CM | POA: Diagnosis not present

## 2022-02-14 DIAGNOSIS — M5412 Radiculopathy, cervical region: Secondary | ICD-10-CM

## 2022-02-14 DIAGNOSIS — M6281 Muscle weakness (generalized): Secondary | ICD-10-CM

## 2022-02-14 DIAGNOSIS — G8929 Other chronic pain: Secondary | ICD-10-CM

## 2022-02-14 NOTE — Therapy (Signed)
[place] [facility] ?[address] ?[place], [place], [address] ?Phone: [phone]   Fax:  [phone] ? ?Physical Therapy Treatment ? ?Patient Details  ?Name: Samantha Tapia ?MRN: [id number] ?Date of Birth: 01/25/1986 ?Referring Provider (PT): Zoe Lan ? ? ?Encounter Date: 02/14/2022 ? ? PT End of Session - 02/14/22 0844   ? ? Visit Number 12   ? Authorization Type UHC   ? PT Start Time 0808   arrived late  ? PT Stop Time [phone]   ? PT Time Calculation (min) 35 min   ? Activity Tolerance Patient tolerated treatment well   ? Behavior During Therapy [facility] for tasks assessed/performed   ? ?  ?  ? ?  ? ? ?Past Medical History:  ?Diagnosis Date  ? Anemia   ? Atypical chest pain   ? Fatigue   ? Heart murmur   ? Mitral regurgitation   ? Palpitations   ? ? ?History reviewed. No pertinent surgical history. ? ?There were no vitals filed for this visit. ? ? Subjective Assessment - 02/14/22 0810   ? ? Subjective I feel better today. I don't know what caused my pain to go crazy.   ? Currently in Pain? No/denies   ? ?  ?  ? ?  ? ? ? ? ? ? ? ? ? ? ? ? ? ? ? ? ? ? ? ? [facility] Adult PT Treatment/Exercise - 02/14/22 0001   ? ?  ? Neck Exercises: Machines for Strengthening  ? UBE (Upper Arm Bike) L3, 3 min forward 3 min back   ?  ? Neck Exercises: Seated  ? Neck Retraction 15 reps   ? Neck Retraction Limitations 3 second holds   ? Cervical Rotation Both;10 reps   ? Cervical Rotation Limitations with chin tuck   ? Lateral Flexion Both;10 reps   ? Lateral Flexion Limitations with chin tuck   ?  ? Neck Exercises: Stretches  ? Upper Trapezius Stretch Right;Left;3 reps;30 seconds   ? Other Neck Stretches scalene stretch 2x30 seconds B   ?  ? Manual Therapy  ? Manual Therapy Myofascial release   ? Myofascial Release suboccipital release   ? Manual Traction manual cervical traction to tolerance   ? ?  ?  ? ?  ? ? ? ? ? ? ? ? ? ? PT Education - 02/14/22 0844   ? ? Education Details massage therapy  referrals, benefits of suboccipital and scalene release   ? Person(s) Educated Patient   ? Methods Explanation   ? Comprehension Verbalized understanding   ? ?  ?  ? ?  ? ? ? PT Short Term Goals - 02/07/22 1351   ? ?  ? PT SHORT TERM GOAL #1  ? Title I with basic HEP   ? Status Achieved   ?  ? PT SHORT TERM GOAL #2  ? Title Patient will report delay in her symptom onset during sitting to > 45 minutes.   ? Baseline Patient reports improved tolerance at her workstation, but still fluctuating.   ? Time 4   ? Period Weeks   ? Status On-going   ? Target Date 03/07/22   ?  ? PT SHORT TERM GOAL #3  ? Title Patient will report delayed tingling in LUE with shoulder elevation > 90 degrees to > 5 minutes.   ? Baseline Limited shoulder flexion due to pain.   ? Time 4   ? Period Weeks   ?  Status On-going   ? Target Date 03/07/22   ? ?  ?  ? ?  ? ? ? ? PT Long Term Goals - 02/07/22 1352   ? ?  ? PT LONG TERM GOAL #1  ? Title I with final HEP   ? Time 4   ? Period Weeks   ? Status On-going   ? Target Date 03/07/22   ?  ? PT LONG TERM GOAL #2  ? Title Patient will be able to sit and work x 4 hours, with rest breaks of 5 minutes every 30 minutes for stretch and re-position, with pain < 4/10   ? Baseline Inconsistant relief.   ? Time 4   ? Period Weeks   ? Status On-going   ? Target Date 03/07/22   ?  ? PT LONG TERM GOAL #3  ? Title Patient will verbalize/demosntrate adjustments to her workspace to improve her trunk suppport, posture.   ? Status Achieved   ?  ? PT LONG TERM GOAL #4  ? Title Patient will be able to work with her shoulders elevated > 90 x at least 10 minutes without tingling or burning in her L ant shoulder.   ? Baseline Limited shoulder flexoin due to pain.   ? Time 4   ? Period Weeks   ? Status On-going   ? Target Date 03/07/22   ? ?  ?  ? ?  ? ? ? ? ? ? ? ? Plan - 02/14/22 0845   ? ? Clinical Impression Statement Malini arrives a little late today doing much better, not sure what caused increase in symptoms last  time. She tells me soft tissue work has been helping quite a bit- I recommended two local massage therapists for long term f/u and maintenance here. Spent time working on techniques to try to address scalene and deep cervical mm group tightness, also included some manual cervical traction as time allowed this morning.   ? Personal Factors and Comorbidities Past/Current Experience   ? Examination-Activity Limitations Reach Overhead;Hygiene/Grooming;Lift;Carry;Caring for Others;Bathing;Dressing   ? Examination-Participation Restrictions Occupation   ? Stability/Clinical Decision Making Stable/Uncomplicated   ? Clinical Decision Making Low   ? Rehab Potential Good   ? PT Frequency 2x / week   ? PT Duration 4 weeks   ? PT Treatment/Interventions ADLs/Self Care Home Management;Electrical Stimulation;Cryotherapy;Neuromuscular re-education;Manual techniques;Dry needling;Therapeutic exercise;Passive range of motion;Therapeutic activities;Functional mobility training;Patient/family education;Iontophoresis 4mg /ml Dexamethasone;Moist Heat;Traction   ? PT Next Visit Plan progress as tolerated   ? PT Home Exercise Plan   ? Consulted and Agree with Plan of Care Patient   ? ?  ?  ? ?  ? ? ?Patient will benefit from skilled therapeutic intervention in order to improve the following deficits and impairments:  Decreased range of motion, Increased fascial restricitons, Impaired sensation, Postural dysfunction, Improper body mechanics, Impaired flexibility, Decreased mobility, Decreased strength, Increased muscle spasms, Cardiopulmonary status limiting activity ? ?Visit Diagnosis: ?Chronic left shoulder pain ? ?Abnormal posture ? ?Muscle weakness (generalized) ? ?Radiculopathy, cervical region ? ? ? ? ?Problem List ?Patient Active Problem List  ? Diagnosis Date Noted  ? Body mass index between 19-24, adult 01/21/2014  ? Iron deficiency anemia 01/21/2014  ? Personal history of other genital system and obstetric  disorders(V13.29) 09/29/2013  ? Noninflammatory disorder of vagina 08/07/2013  ? Irregular menstrual cycle 01/12/2013  ? Allergic rhinitis 01/08/2011  ? ?Auria Mckinlay U PT, DPT, PN2  ? ?Supplemental Physical Therapist ?[place]  ? ? ? ? ? ?  [place] ?[facility] ?[id number] ?[place], [place], [address] ?Phone: [phone]   Fax:  [phone] ? ?Name: Lynnda Childashanka Beaudin ?MRN: [id number] ?Date of Birth: 08-Sep-1986 ? ? ? ?

## 2022-02-28 ENCOUNTER — Ambulatory Visit: Payer: 59 | Admitting: Physical Therapy

## 2022-02-28 ENCOUNTER — Encounter: Payer: Self-pay | Admitting: Physical Therapy

## 2022-02-28 DIAGNOSIS — R293 Abnormal posture: Secondary | ICD-10-CM

## 2022-02-28 DIAGNOSIS — M6281 Muscle weakness (generalized): Secondary | ICD-10-CM

## 2022-02-28 DIAGNOSIS — M25512 Pain in left shoulder: Secondary | ICD-10-CM | POA: Diagnosis not present

## 2022-02-28 DIAGNOSIS — M5412 Radiculopathy, cervical region: Secondary | ICD-10-CM

## 2022-02-28 DIAGNOSIS — G8929 Other chronic pain: Secondary | ICD-10-CM
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
## 2022-03-07 ENCOUNTER — Ambulatory Visit: Payer: 59 | Admitting: Physical Therapy

## 2022-03-09 ENCOUNTER — Ambulatory Visit: Payer: 59 | Attending: Nurse Practitioner | Admitting: Physical Therapy

## 2022-03-09 ENCOUNTER — Encounter: Payer: Self-pay | Admitting: Physical Therapy

## 2022-03-09 DIAGNOSIS — R293 Abnormal posture: Secondary | ICD-10-CM | POA: Diagnosis present

## 2022-03-09 DIAGNOSIS — M25512 Pain in left shoulder: Secondary | ICD-10-CM | POA: Insufficient documentation

## 2022-03-09 DIAGNOSIS — M6281 Muscle weakness (generalized): Secondary | ICD-10-CM | POA: Insufficient documentation

## 2022-03-09 DIAGNOSIS — M5412 Radiculopathy, cervical region: Secondary | ICD-10-CM | POA: Diagnosis present

## 2022-03-09 DIAGNOSIS — G8929 Other chronic pain: Secondary | ICD-10-CM | POA: Insufficient documentation
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
## 2022-03-14 ENCOUNTER — Ambulatory Visit: Payer: 59 | Admitting: Physical Therapy

## 2022-03-14 ENCOUNTER — Encounter: Payer: Self-pay | Admitting: Physical Therapy

## 2022-03-14 DIAGNOSIS — M6281 Muscle weakness (generalized): Secondary | ICD-10-CM

## 2022-03-14 DIAGNOSIS — R293 Abnormal posture: Secondary | ICD-10-CM

## 2022-03-14 DIAGNOSIS — M5412 Radiculopathy, cervical region: Secondary | ICD-10-CM

## 2022-03-14 DIAGNOSIS — M25512 Pain in left shoulder: Secondary | ICD-10-CM | POA: Diagnosis not present

## 2022-03-14 DIAGNOSIS — G8929 Other chronic pain: Secondary | ICD-10-CM
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
## 2022-03-20 ENCOUNTER — Ambulatory Visit: Payer: 59 | Admitting: Physical Therapy

## 2022-03-20 DIAGNOSIS — G8929 Other chronic pain: Secondary | ICD-10-CM

## 2022-03-20 DIAGNOSIS — M5412 Radiculopathy, cervical region: Secondary | ICD-10-CM

## 2022-03-20 DIAGNOSIS — M25512 Pain in left shoulder: Secondary | ICD-10-CM | POA: Diagnosis not present

## 2022-03-20 NOTE — Therapy (Signed)
[place]. [place], [place], [address] Phone: [phone]   Fax:  [phone]  Physical Therapy Treatment  Patient Details  Name: Samantha Tapia MRN: [id number] Date of Birth: 1985/12/20 Referring Provider (PT): Eldridge Abrahams   Encounter Date: 03/20/2022   PT End of Session - 03/20/22 1417     Visit Number 16    Number of Visits 18    Date for PT Re-Evaluation 04/06/22    Authorization Type UHC    PT Start Time 1448    PT Stop Time 1310    PT Time Calculation (min) 35 min             Past Medical History:  Diagnosis Date   Anemia    Atypical chest pain    Fatigue    Heart murmur    Mitral regurgitation    Palpitations     No past surgical history on file.  There were no vitals filed for this visit.   Subjective Assessment - 03/20/22 1414     Subjective pain just won't go away in left cerv and down left arm. esp with stress. pt states she would like to try scrapping    Currently in Pain? Yes    Pain Location Arm    Pain Orientation Left                               [facility] Adult PT Treatment/Exercise - 03/20/22 0001       Manual Therapy   Manual Therapy Soft tissue mobilization;Myofascial release;Taping    Soft tissue mobilization left UT,rhom,lat and left arm lat and posterior    Myofascial Release same as above but with IASTM    Kinesiotex Create Space                       PT Short Term Goals - 03/09/22 0911       PT SHORT TERM GOAL #1   Title I with basic HEP    Time 4    Period Weeks    Status Achieved      PT SHORT TERM GOAL #2   Title Patient will report delay in her symptom onset during sitting to > 45 minutes.    Baseline 6/9- sx start after an hour    Time 4    Period Weeks    Status Achieved      PT SHORT TERM GOAL #3   Title Patient will report delayed tingling in LUE with shoulder elevation > 90 degrees to > 5 minutes.     Baseline 6/9- tingling is random, hard to predict but doesn't get it overhead    Time 2    Period Weeks    Status Partially Met               PT Long Term Goals - 03/14/22 1407       PT LONG TERM GOAL #1   Title I with final HEP    Time 3    Period Weeks    Status On-going      PT LONG TERM GOAL #2   Title Patient will be able to sit and work x 4 hours, with rest breaks of 5 minutes every 30 minutes for stretch and re-position, with pain < 4/10    Baseline 6/9- no issues compliant    Time 3    Period  Weeks    Status Achieved      PT LONG TERM GOAL #3   Title Patient will verbalize/demosntrate adjustments to her workspace to improve her trunk suppport, posture.    Baseline 6/9- improving but still catches self but able to correct    Time 8    Period Weeks    Status Achieved      PT LONG TERM GOAL #4   Title Patient will be able to work with her shoulders elevated > 90 x at least 10 minutes without tingling or burning in her L ant shoulder.    Baseline 6/9- has not tried    Time 3    Period Weeks    Status On-going                   Plan - 03/20/22 1417     Clinical Impression Statement pt arrived requesting we try scrpping or IASTM- addressed all areas of paina nd tightness. pt was limited in some spots with IASTM so switched to [facility] and then followed with taping    PT Treatment/Interventions ADLs/Self Care Home Management;Electrical Stimulation;Cryotherapy;Neuromuscular re-education;Manual techniques;Dry needling;Therapeutic exercise;Passive range of motion;Therapeutic activities;Functional mobility training;Patient/family education;Iontophoresis 18m/ml Dexamethasone;Moist Heat;Traction    PT Next Visit Plan assess response to today session             Patient will benefit from skilled therapeutic intervention in order to improve the following deficits and impairments:  Decreased range of motion, Increased fascial restricitons, Impaired sensation,  Postural dysfunction, Improper body mechanics, Impaired flexibility, Decreased mobility, Decreased strength, Increased muscle spasms, Cardiopulmonary status limiting activity  Visit Diagnosis: Chronic left shoulder pain  Radiculopathy, cervical region     Problem List Patient Active Problem List   Diagnosis Date Noted   Body mass index between 19-24, adult 01/21/2014   Iron deficiency anemia 01/21/2014   Personal history of other genital system and obstetric disorders(V13.29) 09/29/2013   Noninflammatory disorder of vagina 08/07/2013   Irregular menstrual cycle 01/12/2013   Allergic rhinitis 01/08/2011    Samantha Tapia,ANGIE, PTA 03/20/2022, 2:19 PM  [place] [address]Phone: [phone]  Fax:  [phone] Name: TMarzell AllemandMRN: [id number] of Birth: 101/14/1987

## 2022-03-27 ENCOUNTER — Encounter: Payer: Self-pay | Admitting: Physical Therapy

## 2022-03-27 ENCOUNTER — Ambulatory Visit: Payer: 59 | Admitting: Physical Therapy

## 2022-03-27 DIAGNOSIS — G8929 Other chronic pain: Secondary | ICD-10-CM

## 2022-03-27 DIAGNOSIS — M6281 Muscle weakness (generalized): Secondary | ICD-10-CM

## 2022-03-27 DIAGNOSIS — M25512 Pain in left shoulder: Secondary | ICD-10-CM | POA: Diagnosis not present

## 2022-03-27 DIAGNOSIS — M5412 Radiculopathy, cervical region: Secondary | ICD-10-CM

## 2022-03-27 DIAGNOSIS — R293 Abnormal posture: Secondary | ICD-10-CM

## 2022-03-27 NOTE — Therapy (Signed)
[facility] [address] [place], [place], [address] Phone: [phone]   Fax:  [phone]  Physical Therapy Treatment  Patient Details  Name: Samantha Tapia MRN: [id number] Date of Birth: [birth year] Referring Provider (PT): Zoe Lan   Encounter Date: 03/27/2022   PT End of Session - 03/27/22 1403     Visit Number 17    Number of Visits 18    Date for PT Re-Evaluation 04/06/22    Authorization Type UHC    PT Start Time 1320    PT Stop Time 1358    PT Time Calculation (min) 38 min    Activity Tolerance Patient tolerated treatment well    Behavior During Therapy [facility] for tasks assessed/performed             Past Medical History:  Diagnosis Date   Anemia    Atypical chest pain    Fatigue    Heart murmur    Mitral regurgitation    Palpitations     No past surgical history on file.  There were no vitals filed for this visit.                      [facility] Adult PT Treatment/Exercise - 03/27/22 0001       Manual Therapy   Manual Therapy Soft tissue mobilization;Taping;Passive ROM    Soft tissue mobilization L UT, scalenes, LS, pects    Scapular Mobilization Depression, protraction    Manual Traction manual cervical traction to tolerance    Kinesiotex Create Space                       PT Short Term Goals - 03/27/22 1409       PT SHORT TERM GOAL #1   Title I with basic HEP    Time 4    Period Weeks    Status Achieved      PT SHORT TERM GOAL #2   Title Patient will report delay in her symptom onset during sitting to > 45 minutes.    Baseline 6/9- sx start after an hour    Time 4    Period Weeks    Status Achieved      PT SHORT TERM GOAL #3   Title Patient will report delayed tingling in LUE with shoulder elevation > 90 degrees to > 5 minutes.    Baseline Possibly slightly less frequency, but tingling remains.    Time 2    Period Weeks    Status On-going                PT Long Term Goals - 03/27/22 1409       PT LONG TERM GOAL #1   Title I with final HEP    Time 3    Period Weeks    Status On-going      PT LONG TERM GOAL #2   Title Patient will be able to sit and work x 4 hours, with rest breaks of 5 minutes every 30 minutes for stretch and re-position, with pain < 4/10    Baseline 6/9- no issues compliant    Time 3    Period Weeks    Status Achieved      PT LONG TERM GOAL #3   Title Patient will verbalize/demosntrate adjustments to her workspace to improve her trunk suppport, posture.    Baseline 6/9- improving but still catches self but able to correct  Time 8    Period Weeks    Status Achieved      PT LONG TERM GOAL #4   Title Patient will be able to work with her shoulders elevated > 90 x at least 10 minutes without tingling or burning in her L ant shoulder.    Baseline tingling remains present    Time 3    Period Weeks    Status On-going                   Plan - 03/27/22 1330     Clinical Impression Statement Paitent reports temporary relief from last treatment. She started to feel tingling in L arm about 30 minutes after removing tape. She reports improved neck pain, but her relief from [facility] appears to be temporary. Recommend neuro consult or EMG/NCV testing to assess role of cervical nerve impingement. Plan to see paitent one more visit in order to update HEP for maximum relief even if the results are only temporary due to the effect her pain is having on her sleep and work.    Personal Factors and Comorbidities Past/Current Experience    Examination-Activity Limitations Reach Overhead;Hygiene/Grooming;Lift;Carry;Caring for Others;Bathing;Dressing    Examination-Participation Restrictions Occupation    Stability/Clinical Decision Making Stable/Uncomplicated    Clinical Decision Making Low    Rehab Potential Fair    PT Frequency 1x / week    PT Treatment/Interventions ADLs/Self Care Home Management;Electrical  Stimulation;Cryotherapy;Neuromuscular re-education;Manual techniques;Dry needling;Therapeutic exercise;Passive range of motion;Therapeutic activities;Functional mobility training;Patient/family education;Iontophoresis 4mg /ml Dexamethasone;Moist Heat;Traction    PT Next Visit Plan Update HEP    PT Home Exercise Plan [id number]    Consulted and Agree with Plan of Care Patient             Patient will benefit from skilled therapeutic intervention in order to improve the following deficits and impairments:  Decreased range of motion, Increased fascial restricitons, Impaired sensation, Postural dysfunction, Improper body mechanics, Impaired flexibility, Decreased mobility, Decreased strength, Increased muscle spasms, Cardiopulmonary status limiting activity  Visit Diagnosis: Chronic left shoulder pain  Radiculopathy, cervical region  Abnormal posture  Muscle weakness (generalized)     Problem List Patient Active Problem List   Diagnosis Date Noted   Body mass index between 19-24, adult 01/21/2014   Iron deficiency anemia 01/21/2014   Personal history of other genital system and obstetric disorders(V13.29) 09/29/2013   Noninflammatory disorder of vagina 08/07/2013   Irregular menstrual cycle 01/12/2013   Allergic rhinitis 01/08/2011    Iona Beard, DPT 03/27/2022, 2:11 PM  [facility] [address] [place], [place], [address] Phone: [phone]   Fax:  [phone]  Name: Samantha Tapia MRN: [id number] Date of Birth: 12-31-1985

## 2022-04-04 ENCOUNTER — Ambulatory Visit: Payer: 59 | Attending: Nurse Practitioner | Admitting: Physical Therapy

## 2022-04-04 ENCOUNTER — Encounter: Payer: Self-pay | Admitting: Physical Therapy

## 2022-04-04 DIAGNOSIS — R293 Abnormal posture: Secondary | ICD-10-CM | POA: Insufficient documentation

## 2022-04-04 DIAGNOSIS — M6281 Muscle weakness (generalized): Secondary | ICD-10-CM | POA: Diagnosis present

## 2022-04-04 DIAGNOSIS — M25512 Pain in left shoulder: Secondary | ICD-10-CM | POA: Insufficient documentation

## 2022-04-04 DIAGNOSIS — M5412 Radiculopathy, cervical region: Secondary | ICD-10-CM | POA: Insufficient documentation

## 2022-04-04 DIAGNOSIS — G8929 Other chronic pain: Secondary | ICD-10-CM | POA: Insufficient documentation
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
## 2022-04-11 ENCOUNTER — Ambulatory Visit: Payer: 59 | Admitting: Physical Therapy

## 2022-08-13 ENCOUNTER — Ambulatory Visit: Payer: 59 | Admitting: Internal Medicine

## 2022-08-13 VITALS — BP 113/74 | HR 94 | Temp 98.0°F | Ht 63.0 in | Wt 146.6 lb

## 2022-08-13 DIAGNOSIS — I340 Nonrheumatic mitral (valve) insufficiency: Secondary | ICD-10-CM

## 2022-08-13 DIAGNOSIS — R002 Palpitations: Secondary | ICD-10-CM

## 2022-08-13 NOTE — Progress Notes (Signed)
Primary Physician/Referring:  Iona Hansen, NP  Patient ID: Samantha Tapia, female    DOB: June 29, 1986, 36 y.o.   MRN: [id number]  Chief Complaint  Patient presents with   Palpitations   Coronary Artery Disease   HPI:    Samantha Tapia  is a 36 y.o. female with past medical history significant for anemia and mitral regurgitations who is here to establish care with cardiology. She has a history of heart murmur and she was following with cardiology every two years but she has not been in a few years now due to moving. She also has significant family history of heart disease and arrhythmia. Patient has had two episodes where she felt like she was going to pass out followed by severe palpitations and that really concerned her. She does not smoke or drink alcohol. She believes it could be associated with stress and being overly tired but she wanted to get checked out. Patient denies chest pain, shortness of breath, diaphoresis, syncope, edema, PND, orthopnea.   Past Medical History:  Diagnosis Date   Anemia    Atypical chest pain    Fatigue    Heart murmur    Mitral regurgitation    Palpitations    No past surgical history on file. Family History  Problem Relation Age of Onset   Heart failure Mother    Diabetes Father    Heart murmur Brother     Social History   Tobacco Use   Smoking status: Never   Smokeless tobacco: Never  Substance Use Topics   Alcohol use: Yes    Alcohol/week: 0.0 standard drinks of alcohol    Comment: OCC   Marital Status: Single  ROS  Review of Systems  Cardiovascular:  Positive for irregular heartbeat, near-syncope and palpitations.  Neurological:  Positive for light-headedness.   Objective  Blood pressure 113/74, pulse 94, temperature 98 F (36.7 C), height 5\' 3"  (1.6 m), weight 146 lb 9.6 oz (66.5 kg), SpO2 95 %. Body mass index is 25.97 kg/m.     08/13/2022    2:27 PM 05/08/2021    8:25 PM 05/08/2021    7:45 PM  Vitals with BMI  Height  5\' 3"     Weight 146 lbs 10 oz    BMI 25.98    Systolic 113 136 07/08/2021  Diastolic 74 89 88  Pulse 94 79 78     Physical Exam Vitals reviewed.  HENT:     Head: Normocephalic and atraumatic.  Cardiovascular:     Rate and Rhythm: Normal rate and regular rhythm.     Pulses: Normal pulses.     Heart sounds: Normal heart sounds. No murmur heard. Pulmonary:     Effort: Pulmonary effort is normal.     Breath sounds: Normal breath sounds.  Abdominal:     General: Bowel sounds are normal.  Musculoskeletal:     Right lower leg: No edema.     Left lower leg: No edema.  Skin:    General: Skin is warm and dry.  Neurological:     Mental Status: She is alert.     Medications and allergies   Allergies  Allergen Reactions   Metronidazole Nausea And Vomiting    Can't tolerate pills. Able to use vaginal gel   Shellfish-Derived Products     Other reaction(s): EDEMA   Elemental Sulfur Rash     Medication list after today's encounter   Current Outpatient Medications:    BORIC ACID EX, Take 1 capsule  by mouth as needed., Disp: , Rfl:    ferrous sulfate 325 (65 FE) MG tablet, Take 325 mg by mouth daily with breakfast., Disp: , Rfl:    fluticasone (FLONASE) 50 MCG/ACT nasal spray, Place 1 spray into both nostrils daily. (Patient taking differently: Place 1 spray into both nostrils daily as needed for allergies.), Disp: 16 g, Rfl: 0   ibuprofen (ADVIL) 800 MG tablet, Take 1 tablet (800 mg total) by mouth every 6 (six) hours as needed., Disp: 30 tablet, Rfl: 0   Multiple Vitamin (MULTIVITAMIN WITH MINERALS) TABS tablet, Take 1 tablet by mouth daily., Disp: , Rfl:    Probiotic Product (PROBIOTIC-10) CAPS, Take 1 capsule by mouth as needed., Disp: , Rfl:    naproxen (NAPROSYN) 500 MG tablet, Take 1 tablet (500 mg total) by mouth 2 (two) times daily., Disp: 10 tablet, Rfl: 0  Laboratory examination:   Lab Results  Component Value Date   NA 138 09/19/2018   K 3.8 09/19/2018   CO2 24 09/19/2018    GLUCOSE 89 09/19/2018   BUN 13 09/19/2018   CREATININE 0.75 09/19/2018   CALCIUM 9.6 09/19/2018   GFRNONAA >60 09/19/2018       Latest Ref Rng & Units 09/19/2018    6:05 PM 05/28/2010    7:55 PM  CMP  Glucose 70 - 99 mg/dL 89  202   BUN 6 - 20 mg/dL 13  11   Creatinine 5.42 - 1.00 mg/dL 7.06  2.37   Sodium 628 - 145 mmol/L 138  135   Potassium 3.5 - 5.1 mmol/L 3.8  3.4   Chloride 98 - 111 mmol/L 102  107   CO2 22 - 32 mmol/L 24  24   Calcium 8.9 - 10.3 mg/dL 9.6  8.7   Total Protein 6.0 - 8.3 g/dL  7.1   Total Bilirubin 0.3 - 1.2 mg/dL  1.1   Alkaline Phos 39 - 117 U/L  36   AST 0 - 37 U/L  24   ALT 0 - 35 U/L  12       Latest Ref Rng & Units 09/19/2018    6:05 PM 05/28/2010    7:55 PM  CBC  WBC 4.0 - 10.5 K/uL 8.5  14.1   Hemoglobin 12.0 - 15.0 g/dL 31.5  9.5   Hematocrit 36.0 - 46.0 % 36.7  28.0   Platelets 150 - 400 K/uL 427  254     Lipid Panel No results for input(s): "CHOL", "TRIG", "LDLCALC", "VLDL", "HDL", "CHOLHDL", "LDLDIRECT" in the last 8760 hours.  HEMOGLOBIN A1C No results found for: "HGBA1C", "MPG" TSH No results for input(s): "TSH" in the last 8760 hours.  External labs:     Radiology:    Cardiac Studies:   No results found for this or any previous visit from the past 1095 days.     No results found for this or any previous visit from the past 1095 days.   EKG:   08/13/2022: NSR with iRBBB  Assessment     ICD-10-CM   1. Palpitations  R00.2 EKG 12-Lead    2. Nonrheumatic mitral valve regurgitation  I34.0        Orders Placed This Encounter  Procedures   EKG 12-Lead    No orders of the defined types were placed in this encounter.   Medications Discontinued During This Encounter  Medication Reason   cyclobenzaprine (FLEXERIL) 5 MG tablet    benzonatate (TESSALON) 100 MG capsule    metoprolol  tartrate (LOPRESSOR) 25 MG tablet Completed Course     Recommendations:   Samantha Tapia is a 36 y.o. female with mitral  regurgitation and palpitations  Palpitations Consider cardizem 30mg  PRN next visit Patient would like to wait before adding any medications   Nonrheumatic mitral valve regurgitation Echocardiogram ordered - it has been 3+ years since her last cardio appt Follow-up in 1-2 months or sooner if needed     , DO, [facility]  08/13/2022, 2:40 PM Office: [phone] Pager: [phone]

## 2022-09-10 ENCOUNTER — Other Ambulatory Visit: Payer: 59

## 2022-09-13 ENCOUNTER — Ambulatory Visit: Payer: 59

## 2022-09-13 DIAGNOSIS — I340 Nonrheumatic mitral (valve) insufficiency: Secondary | ICD-10-CM

## 2022-09-13 DIAGNOSIS — R002 Palpitations: Secondary | ICD-10-CM

## 2022-09-28 ENCOUNTER — Telehealth: Payer: Self-pay

## 2022-09-28 NOTE — Telephone Encounter (Signed)
Called and let patient know the results. She acknowledged understanding, and had no further questions.

## 2022-09-28 NOTE — Telephone Encounter (Signed)
Patient is calling and wanting to know results of her echo.

## 2022-09-28 NOTE — Telephone Encounter (Signed)
normal

## 2022-11-13 ENCOUNTER — Ambulatory Visit: Payer: 59 | Admitting: Internal Medicine

## 2022-11-13 ENCOUNTER — Encounter: Payer: Self-pay | Admitting: Internal Medicine

## 2022-11-13 VITALS — BP 110/71 | HR 82 | Resp 16 | Ht 63.0 in | Wt 143.0 lb

## 2022-11-13 DIAGNOSIS — R002 Palpitations: Secondary | ICD-10-CM

## 2022-11-13 DIAGNOSIS — I340 Nonrheumatic mitral (valve) insufficiency: Secondary | ICD-10-CM

## 2022-11-13 NOTE — Progress Notes (Signed)
Primary Physician/Referring:  Berkley Harvey, NP  Patient ID: Samantha Tapia, female    DOB: 1985-11-14, 37 y.o.   MRN: [id number]  No chief complaint on file.  HPI:    Samantha Tapia  is a 37 y.o. female with past medical history significant for anemia and mitral regurgitations who is here for a follow-up visit. She has been doing really well since our last visit. No concerns or complaints. No further episodes of feeling light-headed or palpitations. Patient thinks she was just stressed. Patient denies chest pain, shortness of breath, diaphoresis, syncope, edema, PND, orthopnea.   Past Medical History:  Diagnosis Date   Anemia    Atypical chest pain    Fatigue    Heart murmur    Mitral regurgitation    Palpitations    History reviewed. No pertinent surgical history. Family History  Problem Relation Age of Onset   Heart failure Mother    Diabetes Father    Heart murmur Brother     Social History   Tobacco Use   Smoking status: Never   Smokeless tobacco: Never  Substance Use Topics   Alcohol use: Yes    Alcohol/week: 0.0 standard drinks of alcohol    Comment: OCC   Marital Status: Single  ROS  Review of Systems  Cardiovascular:  Negative for irregular heartbeat, near-syncope and palpitations.  Neurological:  Negative for light-headedness.   Objective  Blood pressure 110/71, pulse 82, resp. rate 16, height 5' 3"$  (1.6 m), weight 143 lb (64.9 kg), SpO2 98 %. Body mass index is 25.33 kg/m.     11/13/2022    1:23 PM 08/13/2022    2:27 PM 05/08/2021    8:25 PM  Vitals with BMI  Height 5' 3"$  5' 3"$    Weight 143 lbs 146 lbs 10 oz   BMI [id number]   Systolic [id number]  Diastolic 71 74 89  Pulse 82 94 79     Physical Exam Vitals reviewed.  HENT:     Head: Normocephalic and atraumatic.  Cardiovascular:     Rate and Rhythm: Normal rate and regular rhythm.     Pulses: Normal pulses.     Heart sounds: Normal heart sounds. No murmur heard. Pulmonary:      Effort: Pulmonary effort is normal.     Breath sounds: Normal breath sounds.  Abdominal:     General: Bowel sounds are normal.  Musculoskeletal:     Right lower leg: No edema.     Left lower leg: No edema.  Skin:    General: Skin is warm and dry.  Neurological:     Mental Status: She is alert.     Medications and allergies   Allergies  Allergen Reactions   Metronidazole Nausea And Vomiting    Can't tolerate pills. Able to use vaginal gel   Shellfish-Derived Products     Other reaction(s): EDEMA   Elemental Sulfur Rash     Medication list after today's encounter   Current Outpatient Medications:    BORIC ACID EX, Take 1 capsule by mouth as needed., Disp: , Rfl:    ferrous sulfate 325 (65 FE) MG tablet, Take 325 mg by mouth daily with breakfast., Disp: , Rfl:    fluticasone (FLONASE) 50 MCG/ACT nasal spray, Place 1 spray into both nostrils daily. (Patient taking differently: Place 1 spray into both nostrils daily as needed for allergies.), Disp: 16 g, Rfl: 0   ibuprofen (ADVIL) 800 MG tablet, Take 1 tablet (800 mg  total) by mouth every 6 (six) hours as needed., Disp: 30 tablet, Rfl: 0   Multiple Vitamin (MULTIVITAMIN WITH MINERALS) TABS tablet, Take 1 tablet by mouth daily., Disp: , Rfl:    naproxen (NAPROSYN) 500 MG tablet, Take 1 tablet (500 mg total) by mouth 2 (two) times daily., Disp: 10 tablet, Rfl: 0  Laboratory examination:   Lab Results  Component Value Date   NA 138 09/19/2018   K 3.8 09/19/2018   CO2 24 09/19/2018   GLUCOSE 89 09/19/2018   BUN 13 09/19/2018   CREATININE 0.75 09/19/2018   CALCIUM 9.6 09/19/2018   GFRNONAA >60 09/19/2018       Latest Ref Rng & Units 09/19/2018    6:05 PM 05/28/2010    7:55 PM  CMP  Glucose 70 - 99 mg/dL 89  113   BUN 6 - 20 mg/dL 13  11   Creatinine 0.44 - 1.00 mg/dL 0.75  0.88   Sodium 135 - 145 mmol/L 138  135   Potassium 3.5 - 5.1 mmol/L 3.8  3.4   Chloride 98 - 111 mmol/L 102  107   CO2 22 - 32 mmol/L 24  24    Calcium 8.9 - 10.3 mg/dL 9.6  8.7   Total Protein 6.0 - 8.3 g/dL  7.1   Total Bilirubin 0.3 - 1.2 mg/dL  1.1   Alkaline Phos 39 - 117 U/L  36   AST 0 - 37 U/L  24   ALT 0 - 35 U/L  12       Latest Ref Rng & Units 09/19/2018    6:05 PM 05/28/2010    7:55 PM  CBC  WBC 4.0 - 10.5 K/uL 8.5  14.1   Hemoglobin 12.0 - 15.0 g/dL 11.7  9.5   Hematocrit 36.0 - 46.0 % 36.7  28.0   Platelets 150 - 400 K/uL 427  254     Lipid Panel No results for input(s): "CHOL", "TRIG", "LDLCALC", "VLDL", "HDL", "CHOLHDL", "LDLDIRECT" in the last 8760 hours.  HEMOGLOBIN A1C No results found for: "HGBA1C", "MPG" TSH No results for input(s): "TSH" in the last 8760 hours.  External labs:     Radiology:    Cardiac Studies:   Echocardiogram 09/13/2022: Normal LV systolic function with visual EF 60-65%. Left ventricle cavity is normal in size. Normal left ventricular wall thickness. Normal global wall motion. Normal diastolic filling pattern, normal LAP. No significant valvular heart disease. No prior study for comparison.    EKG:   08/13/2022: NSR with iRBBB  Assessment     ICD-10-CM   1. Palpitations  R00.2     2. Nonrheumatic mitral valve regurgitation  I34.0        No orders of the defined types were placed in this encounter.   No orders of the defined types were placed in this encounter.   Medications Discontinued During This Encounter  Medication Reason   Probiotic Product (PROBIOTIC-10) CAPS      Recommendations:   [name] is a 37 y.o. female with mitral regurgitation and palpitations  Palpitations Palpitations are not bothering her anymore Will not start medical therapy at this time   Nonrheumatic mitral valve regurgitation Echocardiogram reviewed with patient Follow-up in 8 months or sooner if needed     Floydene Flock, DO, [facility]  11/19/2022, 5:56 PM Office: [phone] Pager: [phone]

## 2023-11-12 ENCOUNTER — Ambulatory Visit: Payer: 59 | Admitting: Internal Medicine

## 2023-11-12 ENCOUNTER — Ambulatory Visit: Payer: 59 | Attending: Internal Medicine | Admitting: Cardiology

## 2023-11-12 ENCOUNTER — Encounter: Payer: Self-pay | Admitting: Cardiology

## 2023-11-12 VITALS — BP 116/76 | HR 82 | Resp 16 | Ht 63.0 in | Wt 151.0 lb

## 2023-11-12 DIAGNOSIS — I341 Nonrheumatic mitral (valve) prolapse: Secondary | ICD-10-CM

## 2023-11-12 DIAGNOSIS — R002 Palpitations: Secondary | ICD-10-CM

## 2023-11-12 NOTE — Patient Instructions (Signed)

## 2023-11-12 NOTE — Progress Notes (Signed)
Cardiology Office Note:  .   Date:  11/12/2023  ID:  Samantha Tapia, DOB [birth year], MRN [id number] PCP: Iona Hansen, NP  [facility] Providers Cardiologist:  Yates Decamp, MD   History of Present Illness: .   Samantha Tapia is a 38 y.o. Female patient with borderline prolapse of the mitral valve and mild to moderate mitral regurgitation noted an echocardiogram in 2020 however echocardiogram in December 23 revealing no significant valvular disease.  She now presents for annual visit for follow-up of MR and also palpitations.  Except for occasional palpitations with caffeine, she remains asymptomatic.  She recently moved into her new home just 1 week ago and did heavy moving up and down without any issues.  Discussed the use of AI scribe software for clinical note transcription with the patient, who gave verbal consent to proceed.  History of Present Illness   The patient, a [employer], presents for a follow-up visit regarding a known diagnosis of mitral valve prolapse. She reports no new concerns and denies any chest pain or shortness of breath, even during periods of physical exertion such as moving house and climbing three flights of stairs daily. She does note occasional palpitations associated with caffeine intake, but these are not bothersome. The patient maintains a good level of physical activity and has recently moved into a new home. She also mentions a family history of atrial fibrillation in her mother, who has severe symptoms and is on medication.     Labs   External Labs:  Care everywhere labs 01/28/2023:  Potassium 4.0, BUN 14, creatinine 0.86, EGFR 89 mL.  Hb 11.2/HCT 34.1, platelets 382, normal indicis.  CBC is stable from 01/24/2022.  TSH normal at 1.195.  Total cholesterol 164, triglycerides 41, HDL 68, LDL 84.  Review of Systems  Cardiovascular:  Negative for chest pain, dyspnea on exertion and leg swelling.    Physical Exam:   VS:  BP  116/76 (BP Location: Left Arm, Patient Position: Sitting, Cuff Size: Normal)   Pulse 82   Resp 16   Ht 5\' 3"  (1.6 m)   Wt 151 lb (68.5 kg)   SpO2 98%   BMI 26.75 kg/m    Wt Readings from Last 3 Encounters:  11/12/23 151 lb (68.5 kg)  11/13/22 143 lb (64.9 kg)  08/13/22 146 lb 9.6 oz (66.5 kg)     Physical Exam Neck:     Vascular: No JVD.  Cardiovascular:     Rate and Rhythm: Normal rate and regular rhythm.     Pulses: Intact distal pulses.     Heart sounds: S1 normal and S2 normal. A midsystolic click. Murmur heard.     Crescendo-decrescendo mid to late systolic murmur is present with a grade of 2/6 at the apex.     No gallop.  Pulmonary:     Effort: Pulmonary effort is normal.     Breath sounds: Normal breath sounds.  Abdominal:     General: Bowel sounds are normal.     Palpations: Abdomen is soft.  Musculoskeletal:     Right lower leg: No edema.     Left lower leg: No edema.    Studies Reviewed: .    Echocardiogram 09/13/2022: Normal LV systolic function with visual EF 60-65%. Left ventricle cavity is normal in size. Normal left ventricular wall thickness. Normal global wall motion. Normal diastolic filling pattern, normal LAP. No significant valvular heart disease. No prior study for comparison.  EKG:    EKG  Interpretation Date/Time:  Tuesday November 12 2023 13:50:20 EST Ventricular Rate:  81 PR Interval:  164 QRS Duration:  88 QT Interval:  382 QTC Calculation: 443 R Axis:   63  Text Interpretation: EKG 11/12/2023: Normal sinus rhythm with rate of 81 bpm, incomplete right bundle branch block.  Normal EKG.  No significant change from 09/19/2018. Confirmed by Delrae Rend [phone]) on 11/12/2023 1:51:57 PM    Medications and allergies    Allergies  Allergen Reactions   Metronidazole Nausea And Vomiting    Can't tolerate pills. Able to use vaginal gel   Shellfish-Derived Products     Other reaction(s): EDEMA   Elemental Sulfur Rash     Current  Outpatient Medications:    BORIC ACID EX, Take 1 capsule by mouth as needed., Disp: , Rfl:    ferrous sulfate 325 (65 FE) MG tablet, Take 325 mg by mouth daily with breakfast., Disp: , Rfl:    fluticasone (FLONASE) 50 MCG/ACT nasal spray, Place 1 spray into both nostrils daily. (Patient taking differently: Place 1 spray into both nostrils daily as needed for allergies.), Disp: 16 g, Rfl: 0   ibuprofen (ADVIL) 800 MG tablet, Take 1 tablet (800 mg total) by mouth every 6 (six) hours as needed., Disp: 30 tablet, Rfl: 0   Multiple Vitamin (MULTIVITAMIN WITH MINERALS) TABS tablet, Take 1 tablet by mouth daily., Disp: , Rfl:    naproxen (NAPROSYN) 500 MG tablet, Take 1 tablet (500 mg total) by mouth 2 (two) times daily., Disp: 10 tablet, Rfl: 0   triamcinolone ointment (KENALOG) 0.1 %, Apply 1 Application topically 2 (two) times daily., Disp: , Rfl:    ASSESSMENT AND PLAN: .      ICD-10-CM   1. Mitral valve prolapse  I34.1 EKG 12-Lead    2. Palpitations  R00.2       Assessment and Plan    Mitral Valve Prolapse Chronic mitral valve prolapse presents with occasional palpitations, particularly after caffeine intake. The last echocardiogram showed no significant mitral regurgitation, and the physical exam confirms classic mitral valve prolapse.   There are no current symptoms of chest pain or shortness of breath. Mitral valve prolapse can remain asymptomatic for years but may worsen over time. Immediate follow-up is advised if the murmur becomes more pronounced or if new symptoms such as shortness of breath develop. Most cases do not experience significant progression, but monitoring is essential. Monitor for changes in murmur or symptoms, and refer to cardiology if necessary.   Stay well-hydrated to minimize symptoms and maintain regular follow-up with the primary care physician.  Anemia Mild anemia is likely due to heavy menstrual cycles, with a mildly reduced blood count. Iron supplementation is  being taken. Continue iron supplementation and discuss with the primary care physician if anemia worsens or symptoms develop.  General Health Maintenance Overall health is good, with normal cholesterol, kidney function, and EKG. Regular physical activity is maintained. Encourage continued physical activity and maintain regular follow-ups with the primary care physician for routine health maintenance.  Follow-up Inform Samantha Tapia of the cardiology visit and findings. No need for annual cardiology follow-up unless new symptoms or changes in the murmur occur.  No endocarditis prophylaxis is indicated.     Signed,  Yates Decamp, MD, [facility] 11/12/2023, 2:07 PM [facility] [address] [place], [place] [address] Phone: [phone]. Fax:  [phone]

## 2024-10-05 ENCOUNTER — Telehealth: Payer: Self-pay | Admitting: Cardiology

## 2024-10-05 NOTE — Telephone Encounter (Signed)
 Would make an appointment with me or her PCP

## 2024-10-05 NOTE — Telephone Encounter (Signed)
 Spoke with pt, she got sick before christmas and around that time is when the palpitations started. She gets a fluttering in her chest and at times she will need to take an extra breath due to the palpitations. She stopped her caffeine intake about 1 week ago and there has been no change in the fluttering. There have been no other changes in diet or stress. Aware will forward to dr ladona to review.

## 2024-10-05 NOTE — Telephone Encounter (Signed)
 Patient c/o Palpitations:  STAT if patient reporting lightheadedness, shortness of breath, or chest pain  How long have you had palpitations/irregular HR/ Afib? Palpitations for the past two weeks. Are you having the symptoms now? Yes   Are you currently experiencing lightheadedness, SOB or CP? no  Do you have a history of afib (atrial fibrillation) or irregular heart rhythm? Yes has history  Have you checked your BP or HR? (document readings if available): no  Are you experiencing any other symptoms? No other symptoms. She states at times gasping for air with the palpitations.

## 2024-10-06 NOTE — Telephone Encounter (Signed)
 Called patient back with Dr. Godfrey advisement. Patient will call her PCP and if she cannot get a sooner appointment, she will call back and schedule with Dr. Ladona.

## 2024-10-12 NOTE — Progress Notes (Unsigned)
" °  Cardiology Office Note   Date:  10/12/2024  ID:  Samantha, Tapia November 29, 1985, MRN [id number] PCP: Joshua Santana CROME, NP  [facility] Providers Cardiologist:  Gordy Bergamo, MD   History of Present Illness Samantha Tapia is a 39 y.o. female with a past medical history of borderline prolapse of the mitral valve and mild to moderate mitral regurgitation noted on echo in 2020 however echo in December 23 revealing no significant valvular disease.  She also has a history of occasional palpitations with caffeine but remained asymptomatic she is now here for follow-up appointment.  She reported no new concerns and denied any chest pain or shortness of breath even during periods of physical exertion such as moving and climbing 3 flights of stairs daily.  Does occasionally note occasional palpitations with caffeine intake these are not bothersome.  She remains physically active.  Mention she does have family history of atrial fibrillation on her mother side who has severe symptoms and is on medication.  Today, she***  ROS: ***  Studies Reviewed      Echocardiogram 09/13/2022: Normal LV systolic function with visual EF 60-65%. Left ventricle cavity is normal in size. Normal left ventricular wall thickness. Normal global wall motion. Normal diastolic filling pattern, normal LAP. No significant valvular heart disease. No prior study for comparison.   Risk Assessment/Calculations {Does this patient have ATRIAL FIBRILLATION?:[phone]} No BP recorded.  {Refresh Note OR Click here to enter BP  :1}***       Physical Exam VS:  There were no vitals taken for this visit.       Wt Readings from Last 3 Encounters:  11/12/23 151 lb (68.5 kg)  11/13/22 143 lb (64.9 kg)  08/13/22 146 lb 9.6 oz (66.5 kg)    GEN: Well nourished, well developed in no acute distress NECK: No JVD; No carotid bruits CARDIAC: ***RRR, no murmurs, rubs, gallops RESPIRATORY:  Clear to auscultation without rales,  wheezing or rhonchi  ABDOMEN: Soft, non-tender, non-distended EXTREMITIES:  No edema; No deformity   ASSESSMENT AND PLAN Mitral valve prolapse, chronic Palpitations    {Are you ordering a CV Procedure (e.g. stress test, cath, DCCV, TEE, etc)?   Press F2        :[id number]}  Dispo: ***  Signed, Orren LOISE Fabry, PA-C   "

## 2024-10-13 ENCOUNTER — Ambulatory Visit: Attending: Cardiology | Admitting: Cardiology

## 2024-10-13 ENCOUNTER — Ambulatory Visit: Admitting: Physician Assistant

## 2024-10-13 ENCOUNTER — Encounter: Payer: Self-pay | Admitting: Cardiology

## 2024-10-13 ENCOUNTER — Other Ambulatory Visit (HOSPITAL_COMMUNITY): Payer: Self-pay

## 2024-10-13 VITALS — BP 124/77 | HR 91 | Ht 64.0 in | Wt 151.8 lb

## 2024-10-13 DIAGNOSIS — R002 Palpitations: Secondary | ICD-10-CM

## 2024-10-13 DIAGNOSIS — D508 Other iron deficiency anemias: Secondary | ICD-10-CM | POA: Diagnosis not present

## 2024-10-13 DIAGNOSIS — N920 Excessive and frequent menstruation with regular cycle: Secondary | ICD-10-CM | POA: Diagnosis not present

## 2024-10-13 DIAGNOSIS — I341 Nonrheumatic mitral (valve) prolapse: Secondary | ICD-10-CM

## 2024-10-13 DIAGNOSIS — I340 Nonrheumatic mitral (valve) insufficiency: Secondary | ICD-10-CM

## 2024-10-13 MED ORDER — PROPRANOLOL HCL 20 MG PO TABS
20.0000 mg | ORAL_TABLET | Freq: Two times a day (BID) | ORAL | 1 refills | Status: AC | PRN
  Filled 2024-10-13: qty 60, 30d supply, fill #0

## 2024-10-13 NOTE — Patient Instructions (Addendum)
 Medication Instructions:  START   propranolol  (INDERAL ) 20 MG tablet        Take 1 tablet (20 mg total) by mouth 2 (two) times daily as needed (Palpitations   *If you need a refill on your cardiac medications before your next appointment, please call your pharmacy*  Follow-Up: At [facility], you and your health needs are our priority.  As part of our continuing mission to provide you with exceptional heart care, our providers are all part of one team.  This team includes your primary Cardiologist (physician) and Advanced Practice Providers or APPs (Physician Assistants and Nurse Practitioners) who all work together to provide you with the care you need, when you need it.  Your next appointment:   As Needed  Provider:   Gordy Bergamo, MD             We recommend signing up for the patient portal called MyChart.  Patients are able to view lab/test results, encounter notes, upcoming appointments, etc.  Non-urgent messages can be sent to your provider as well, go to [email or web address].

## 2024-10-13 NOTE — Progress Notes (Signed)
 " Cardiology Office Note:  .   Date:  10/13/2024  ID:  Samantha Tapia, DOB [birth year], MRN [id number] PCP: Joshua Santana CROME, NP  [facility] Providers Cardiologist:  Gordy Bergamo, MD   History of Present Illness: .   Samantha Tapia is a 39 y.o. Female patient with borderline prolapse of the mitral valve and mild to moderate mitral regurgitation noted an echocardiogram in 2020 however echocardiogram in December 23 revealing no significant valvular disease.  Essentially she has structurally normal heart by echocardiogram.  She made an appointment to see us  back in view of frequent palpitations mostly occurring during rest and not with exertional activity.  There is no family history of sudden cardiac death or significant arrhythmias.  She is a nontobacco user, drinks alcohol very rarely, is physically active.  She is accompanied by her fianc.    Discussed the use of AI scribe software for clinical note transcription with the patient, who gave verbal consent to proceed.  History of Present Illness Samantha Tapia is a 39 year old female with iron deficiency anemia who presents with heart palpitations. She is accompanied by her fiance.  She has had frequent heart palpitations for three weeks, described as flutters and a flip-flop sensation rather than a racing heart. They occur more often at rest, such as when sitting, watching TV, or lying down. She stopped drinking coffee three weeks ago, which she previously consumed regularly.  Her primary care physician obtained an EKG and labs and noted low magnesium and very low iron. Her hemoglobin is 11.2, hematocrit 34.3, and ferritin 8. She has heavy menstrual cycles lasting five days, with three to four days of heavy flow and clots, and she is taking prescribed iron pills.  She reports no recent change in sleep, stress, or exercise tolerance, including walking or climbing stairs. She occasionally drinks alcohol.  Cardiac Studies relevent.     Echocardiogram 09/13/2022: Normal LV systolic function with visual EF 60-65%. Left ventricle cavity is normal in size. Normal left ventricular wall thickness. Normal global wall motion. Normal diastolic filling pattern, normal LAP. No significant valvular heart disease. No prior study for comparison.  EKG:   EKG Interpretation Date/Time:  Tuesday October 13 2024 11:50:36 EST Ventricular Rate:  91 PR Interval:  142 QRS Duration:  100 QT Interval:  356 QTC Calculation: 437 R Axis:   85  Text Interpretation: EKG 10/13/2024: Normal sinus rhythm at rate of 91 bpm, incomplete right bundle branch block.  Otherwise normal EKG.  No change from 11/12/2023. Confirmed by Amel Kitch, Jagadeesh [id number]) on 10/13/2024 12:07:45 PM  Labs  Care everywhere/Faxed External Labs:  Reviewed her CBC, BMP and also mag, mild anemia with microcytic indicis, magnesium at 1.7.  ROS  Review of Systems  Cardiovascular:  Positive for palpitations. Negative for chest pain, dyspnea on exertion, leg swelling and syncope.   Physical Exam:   VS:  BP 124/77 (BP Location: Left Arm, Patient Position: Sitting, Cuff Size: Normal)   Pulse 91   Ht 5' 4 (1.626 m)   Wt 151 lb 12.8 oz (68.9 kg)   SpO2 100%   BMI 26.06 kg/m    Wt Readings from Last 3 Encounters:  10/13/24 151 lb 12.8 oz (68.9 kg)  11/12/23 151 lb (68.5 kg)  11/13/22 143 lb (64.9 kg)    BP Readings from Last 3 Encounters:  10/13/24 124/77  11/12/23 116/76  11/13/22 110/71   Physical Exam Neck:     Vascular: No carotid bruit or JVD.  Cardiovascular:     Rate and Rhythm: Normal rate and regular rhythm. Occasional Extrasystoles are present.    Pulses: Intact distal pulses.     Heart sounds: Normal heart sounds. No murmur heard.    No gallop.  Pulmonary:     Effort: Pulmonary effort is normal.     Breath sounds: Normal breath sounds.  Abdominal:     General: Bowel sounds are normal.     Palpations: Abdomen is soft.  Musculoskeletal:     Right  lower leg: No edema.     Left lower leg: No edema.     ASSESSMENT AND PLAN: .      ICD-10-CM   1. Palpitations  R00.2 EKG 12-Lead    propranolol  (INDERAL ) 20 MG tablet    2. Other iron deficiency anemia  D50.8     3. Menorrhagia with regular cycle  N92.0      Assessment & Plan Palpitations due to premature atrial and ventricular complexes Palpitations characterized by heart flutters, primarily at rest, likely due to premature atrial and ventricular complexes. These are benign and not life-threatening, often exacerbated by stress, infection, or lifestyle factors such as alcohol or caffeine intake. No evidence of underlying cardiac pathology. Previous echocardiogram in 2023 showed normal results despite mitral valve prolapse. - Patient had occasional ectopy during prolonged auscultation and was symptomatic. - Prescribed propranolol  10-20 mg as needed for palpitations, up to twice daily. - Reassured that palpitations are benign and not life-threatening.  Iron deficiency anemia secondary to menorrhagia Iron deficiency anemia with hemoglobin at 11.2 g/dL, hematocrit at 65.6%, and ferritin at 8 ng/mL, indicating severe iron deficiency. Likely secondary to heavy menstrual bleeding with clots. Requires further evaluation by OB/GYN to address underlying cause of menorrhagia. - Advised follow-up with OB/GYN to evaluate heavy menstrual bleeding and potential causes such as fibroids. - Discussed potential need for iron supplementation, but deferred prescription to primary care or OB/GYN.   Follow up: As needed  Signed,  Gordy Bergamo, MD, [facility] 10/13/2024, 6:09 PM [facility] [address] [place], [place] [address] Phone: [phone]. Fax:  [phone]  "
